# Patient Record
Sex: Female | Born: 1980 | Race: Black or African American | Hispanic: No | Marital: Married | Smoking: Never smoker
Health system: Southern US, Community
[De-identification: ages and names within clinical notes are randomized; demographics above are authoritative.]

## PROBLEM LIST (undated history)

## (undated) ENCOUNTER — Inpatient Hospital Stay (HOSPITAL_COMMUNITY): Payer: Self-pay

## (undated) DIAGNOSIS — D219 Benign neoplasm of connective and other soft tissue, unspecified: Secondary | ICD-10-CM

## (undated) DIAGNOSIS — Z789 Other specified health status: Secondary | ICD-10-CM

## (undated) HISTORY — PX: NO PAST SURGERIES: SHX2092

---

## 2001-04-24 ENCOUNTER — Emergency Department (HOSPITAL_COMMUNITY): Admission: EM | Admit: 2001-04-24 | Discharge: 2001-04-24 | Payer: Self-pay | Admitting: Emergency Medicine

## 2008-10-05 ENCOUNTER — Encounter: Payer: Self-pay | Admitting: *Deleted

## 2008-10-09 ENCOUNTER — Ambulatory Visit: Payer: Self-pay | Admitting: Family Medicine

## 2008-10-09 ENCOUNTER — Encounter: Payer: Self-pay | Admitting: Family Medicine

## 2008-10-11 ENCOUNTER — Encounter: Payer: Self-pay | Admitting: Family Medicine

## 2009-09-06 ENCOUNTER — Ambulatory Visit: Payer: Self-pay | Admitting: Family Medicine

## 2009-11-08 ENCOUNTER — Encounter: Payer: Self-pay | Admitting: Family Medicine

## 2009-11-08 ENCOUNTER — Ambulatory Visit: Payer: Self-pay | Admitting: Family Medicine

## 2009-11-08 DIAGNOSIS — N760 Acute vaginitis: Secondary | ICD-10-CM | POA: Insufficient documentation

## 2009-11-08 LAB — CONVERTED CEMR LAB
Chlamydia, DNA Probe: NEGATIVE
GC Probe Amp, Genital: NEGATIVE

## 2010-01-13 ENCOUNTER — Encounter: Payer: Self-pay | Admitting: *Deleted

## 2010-04-08 NOTE — Assessment & Plan Note (Signed)
Summary: vag prob,df   Vital Signs:  Patient profile:   30 year old female Height:      61 inches Weight:      115 pounds BMI:     21.81 Temp:     98.1 degrees F Pulse rate:   65 / minute BP sitting:   125 / 78  (left arm) Cuff size:   regular  Vitals Entered By: Dennison Nancy RN (November 08, 2009 4:33 PM) CC: Vaginal problems Is Patient Diabetic? No Pain Assessment Patient in pain? no        CC:  Vaginal problems.  History of Present Illness: Developed vagnial discharge out of the blue and is worried that she has a STD.  She has one boyfriend, uses condoms, and has just started OCP.  She stopped her OCPs when the discharge started.  There is no odor, no itch, she denies abdominal pain.  Habits & Providers  Alcohol-Tobacco-Diet     Alcohol drinks/day: 0     Alcohol Counseling: not indicated; patient does not drink     Tobacco Status: never     Tobacco Counseling: not indicated; no tobacco use     Passive Smoke Exposure: no  Current Medications (verified): 1)  Tri-Sprintec 0.18/0.215/0.25 Mg-35 Mcg Tabs (Norgestim-Eth Estrad Triphasic) .... Take One Tablet At The Same Time Daily.  Dispense One Pack  Allergies (verified): No Known Drug Allergies  Review of Systems General:  Denies fever. GU:  Complains of discharge; denies abnormal vaginal bleeding, dysuria, genital sores, and urinary frequency.  Physical Exam  General:  Well-developed,well-nourished,in no acute distress; alert,appropriate and cooperative throughout examination Genitalia:  Normal introitus for age, no external lesions, no vaginal discharge, mucosa pink and moist, no vaginal or cervical lesions, no vaginal atrophy, no friaility or hemorrhage, normal uterus size and position, no adnexal masses or tenderness Strong odor but no clues on wet mount   Impression & Recommendations:  Problem # 1:  VULVOVAGINITIS (ICD-616.10) Reasurance, STD screen, low risk with one partner, uses condoms, and on  OCPs. Orders: GC/Chlamydia-[facility] ([id number]) Wet Prep- [facility] [phone]) [facility]- Est Level  3 (95621)  Complete Medication List: 1)  Tri-sprintec 0.18/0.215/0.25 Mg-35 Mcg Tabs (Norgestim-eth estrad triphasic) .... Take one tablet at the same time daily.  dispense one pack  Patient Instructions: 1)  Please schedule a follow-up appointment as needed .   Laboratory Results  Date/Time Received: November 08, 2009 4:53 PM  Date/Time Reported: November 08, 2009 4:56 PM   November 08, 2009 4:58 PM   [employer] Source: vaginal WBC/hpf: 0-2 Bacteria/hpf: trace  Rods Clue cells/hpf: none  Negative whiff Yeast/hpf: none Trichomonas/hpf: none Comments: rare RBC present ...........test performed by...........Marland KitchenTerese Door, CMA

## 2010-04-08 NOTE — Miscellaneous (Signed)
Summary: needs appointment  Clinical Lists Changes  message left to return call to discuss making appointment and med refill. Theresia Lo RN  January 13, 2010 5:35 PM

## 2010-04-08 NOTE — Assessment & Plan Note (Signed)
Summary: discuss birth control,tcb   Vital Signs:  Patient profile:   30 year old female Height:      61 inches Weight:      118.2 pounds BMI:     22.41 Temp:     98.2 degrees F oral Pulse rate:   78 / minute BP sitting:   110 / 77  (left arm) Cuff size:   regular  Vitals Entered By: Gladstone Pih (September 06, 2009 10:32 AM) CC: discuss birth control Is Patient Diabetic? No Pain Assessment Patient in pain? no        CC:  discuss birth control.  History of Present Illness: 30 yo healthy female here to discuss birth control.  G0P0 planning on conception in next 1-2 years.  Would like contraception until then. Has not ever used anything except for condoms. Non smoker.  No fam hx of PE,DVT.  LMP early June.  Habits & Providers  Alcohol-Tobacco-Diet     Tobacco Status: never  Current Medications (verified): 1)  Tri-Sprintec 0.18/0.215/0.25 Mg-35 Mcg Tabs (Norgestim-Eth Estrad Triphasic) .... Take One Tablet At The Same Time Daily.  Dispense One Pack  Allergies (verified): No Known Drug Allergies  Family History: Grandmother died from breast cancer No Hx PE/DVT  Review of Systems      See HPI GU:  Denies abnormal vaginal bleeding, discharge, and dysuria.  Physical Exam  General:  Well-developed,well-nourished,in no acute distress; alert,appropriate and cooperative throughout examination. vitals reviewed   Impression & Recommendations:  Problem # 1:  CONTRACEPTIVE MANAGEMENT (ICD-V25.09)  Discussed all options including mirena and implanon but not cost effective given desired fertility in 1-2 years.  Pt would like to try OCP and if cannot remember will ask for depo.  Will f/u in 1 month to see how it is going and to get annual gynecological exam.  Orders: [facility]- Est Level  3 ([id number])  Complete Medication List: 1)  Tri-sprintec 0.18/0.215/0.25 Mg-35 Mcg Tabs (Norgestim-eth estrad triphasic) .... Take one tablet at the same time daily.  dispense one pack  Patient  Instructions: 1)  Make appointment for annual gynecological exam after 10/08/2009. 2)  There are many birth control options!  Please let me know if you have any problems. 3)  Nice to meet you! Prescriptions: TRI-SPRINTEC 0.18/0.215/0.25 MG-35 MCG TABS (NORGESTIM-ETH ESTRAD TRIPHASIC) take one tablet at the same time daily.  Dispense one pack  #1 x 2   Entered and Authorized by:   Delbert Harness MD   Signed by:   Delbert Harness MD on 09/06/2009   Method used:   Electronically to        [facility]  [address] [phone] (retail)       [address]       [place], [place]  [address]       Ph: [phone] or [phone]       Fax: [phone]   RxID:   [id number]

## 2010-04-25 ENCOUNTER — Encounter: Payer: Self-pay | Admitting: *Deleted

## 2010-10-12 ENCOUNTER — Inpatient Hospital Stay (INDEPENDENT_AMBULATORY_CARE_PROVIDER_SITE_OTHER)
Admission: RE | Admit: 2010-10-12 | Discharge: 2010-10-12 | Disposition: A | Discharge status: Home / Self Care | Payer: BC Managed Care – PPO | Source: Ambulatory Visit | Attending: Family Medicine | Admitting: Family Medicine

## 2010-10-12 DIAGNOSIS — M79609 Pain in unspecified limb: Secondary | ICD-10-CM

## 2010-12-11 ENCOUNTER — Encounter: Payer: BC Managed Care – PPO | Admitting: Family Medicine

## 2010-12-24 ENCOUNTER — Encounter: Payer: BC Managed Care – PPO | Admitting: Family Medicine

## 2010-12-30 ENCOUNTER — Other Ambulatory Visit: Payer: Self-pay | Admitting: Family Medicine

## 2010-12-30 ENCOUNTER — Ambulatory Visit (INDEPENDENT_AMBULATORY_CARE_PROVIDER_SITE_OTHER): Payer: BC Managed Care – PPO | Admitting: Family Medicine

## 2010-12-30 ENCOUNTER — Encounter: Payer: Self-pay | Admitting: Family Medicine

## 2010-12-30 ENCOUNTER — Telehealth: Payer: Self-pay | Admitting: Family Medicine

## 2010-12-30 VITALS — BP 131/83 | HR 52 | Temp 97.9°F | Ht 60.0 in | Wt 108.8 lb

## 2010-12-30 DIAGNOSIS — Z23 Encounter for immunization: Secondary | ICD-10-CM

## 2010-12-30 MED ORDER — NORGESTIM-ETH ESTRAD TRIPHASIC 0.18/0.215/0.25 MG-35 MCG PO TABS: 1.0000 | ORAL_TABLET | Freq: Every day | ORAL | Status: DC

## 2010-12-30 NOTE — Progress Notes (Signed)
  Subjective:     Renee Coleman is a 30 y.o. female and is here for a comprehensive physical exam. The patient reports no problems.  Declines pap smear at this time.  History   Social History  . Marital Status: Single   Occupational History  . Works as a Midwife   Social History Main Topics  . Smoking status: Never Smoker   . Smokeless tobacco: Denies  . Alcohol Use: Social drinker on the weekends  . Drug Use: Denies  . Sexually Active: Denies    Health Maintenance  Topic Date Due  . Influenza Vaccine  12/08/2010  . Pap Smear  10/10/2011  . Tetanus/tdap  12/29/2020    The following portions of the patient's history were reviewed and updated as appropriate: allergies, current medications, past medical history and past social history.  Review of Systems Pertinent items are noted in HPI.   Objective:    General appearance: alert, cooperative and no distress Head: Normocephalic, without obvious abnormality, atraumatic Eyes: conjunctivae/corneas clear. PERRL, EOM's intact. Fundi benign. Neck: no adenopathy, no JVD and supple, symmetrical, trachea midline Back: symmetric, no curvature. ROM normal. No CVA tenderness. Lungs: clear to auscultation bilaterally Heart: regular rate and rhythm, S1, S2 normal, no murmur, click, rub or gallop Abdomen: soft, non-tender; bowel sounds normal; no masses,  no organomegaly Extremities: extremities normal, atraumatic, no cyanosis or edema Pulses: 2+ and symmetric Skin: Skin color, texture, turgor normal. No rashes or lesions    Assessment:    Healthy female exam.     Plan:    Please schedule annual follow up exam in 12 months or sooner as needed.

## 2010-12-30 NOTE — Telephone Encounter (Signed)
Refill request.  Pt has appt today for CPE.

## 2010-12-30 NOTE — Telephone Encounter (Signed)
Addended by: Tye Savoy, IVY on: 12/30/2010 04:37 PM   Modules accepted: Orders

## 2010-12-30 NOTE — Patient Instructions (Signed)
Please follow up appointment in 12 months or sooner as needed.

## 2010-12-30 NOTE — Telephone Encounter (Signed)
Will forward message to Dr. Tye Savoy. Patient has appointment with her for CPE this afternoon.

## 2011-01-07 NOTE — Telephone Encounter (Signed)
Refill request

## 2011-01-08 ENCOUNTER — Ambulatory Visit (INDEPENDENT_AMBULATORY_CARE_PROVIDER_SITE_OTHER): Payer: BC Managed Care – PPO | Admitting: Family Medicine

## 2011-01-08 ENCOUNTER — Other Ambulatory Visit: Payer: Self-pay | Admitting: Family Medicine

## 2011-01-08 ENCOUNTER — Encounter: Payer: Self-pay | Admitting: Family Medicine

## 2011-01-08 VITALS — BP 138/94 | HR 68 | Temp 98.2°F | Ht 60.0 in | Wt 107.0 lb

## 2011-01-08 DIAGNOSIS — Z202 Contact with and (suspected) exposure to infections with a predominantly sexual mode of transmission: Secondary | ICD-10-CM | POA: Insufficient documentation

## 2011-01-08 DIAGNOSIS — Z20828 Contact with and (suspected) exposure to other viral communicable diseases: Secondary | ICD-10-CM

## 2011-01-08 DIAGNOSIS — N760 Acute vaginitis: Secondary | ICD-10-CM

## 2011-01-08 LAB — POCT WET PREP (WET MOUNT): Clue Cells Wet Prep HPF POC: NEGATIVE

## 2011-01-08 NOTE — Progress Notes (Signed)
  Subjective:    Patient ID: Renee Coleman, female    DOB: Sep 19, 1980, 30 y.o.   MRN: [id number]  HPI  Patient presents to clinic concerned that she may have been exposed to herpes simplex virus, genital. Patient has had one sexual partner in the last 10 months. She has had one sexual encounter with him in April. 5 days ago her partner told her that he had genital herpes patient states that a condom was used during intercourse, as she is still concerned that she may have come in contact with disease.   Patient has no complaints. She denies any pain with urination, vaginal discharge, vaginal bleeding, or pain with intercourse. She denies any pelvic pain or low back pain. Denies any fevers, chills, nausea/vomiting. Patient states that her partner lives in [place], so she does not know if he has been faithful to her. Patient would like to get tested for her gonorrhea/Chlamydia, genital herpes, and other sexual transmitted diseases.  Review of Systems  Per history of present illness    Objective:   Physical Exam  Constitutional: No distress.  Abdominal: Soft. Bowel sounds are normal. She exhibits no distension. There is no tenderness. There is no rebound and no guarding.  Genitourinary: There is no rash, tenderness, lesion or injury on the right labia. There is no rash, tenderness, lesion or injury on the left labia. Cervix exhibits no motion tenderness, no discharge and no friability. Right adnexum displays no mass, no tenderness and no fullness. Left adnexum displays no mass, no tenderness and no fullness. No erythema, tenderness or bleeding around the vagina. No foreign body around the vagina. Vaginal discharge found.  No sores or herpetic lesions noted.        Assessment & Plan:

## 2011-01-08 NOTE — Patient Instructions (Signed)
It was good to see you again. I will call or send you a letter with recent lab results. If you have any questions/concerns, please call our office.  Genital Herpes  Genital herpes is a sexually transmitted disease. This means that it is a disease passed by having sex with an infected person. There is no cure for genital herpes. The time between attacks can be months to years. The virus may live in a person but produce no problems (symptoms). This infection can be passed to a baby as it travels down the birth canal (vagina). In a newborn, this can cause central nervous system damage, eye damage, or even death. The virus that causes genital herpes is usually HSV-2 virus. The virus that causes oral herpes is usually HSV-1. The diagnosis (learning what is wrong) is made through culture results. SYMPTOMS  Usually symptoms of pain and itching begin a few days to a week after contact. It first appears as small blisters that progress to small painful ulcers which then scab over and heal after several days. It affects the outer genitalia, birth canal, cervix, penis, anal area, buttocks, and thighs. HOME CARE INSTRUCTIONS   Keep ulcerated areas dry and clean.   Take medications as directed. Antiviral medications can speed up healing. They will not prevent recurrences or cure this infection. These medications can also be taken for suppression if there are frequent recurrences.   While the infection is active, it is contagious. Avoid all sexual contact during active infections.   Condoms may help prevent spread of the herpes virus.   Practice safe sex.   Wash your hands thoroughly after touching the genital area.   Avoid touching your eyes after touching your genital area.   Inform your caregiver if you have had genital herpes and become pregnant. It is your responsibility to insure a safe outcome for your baby in this pregnancy.   Only take over-the-counter or prescription medicines for pain,  discomfort, or fever as directed by your caregiver.  SEEK MEDICAL CARE IF:   You have a recurrence of this infection.   You do not respond to medications and are not improving.   You have new sources of pain or discharge which have changed from the original infection.   You have an oral temperature above 102 F (38.9 C).   You develop abdominal pain.   You develop eye pain or signs of eye infection.    Document Released: 02/21/2000 Document Revised: 11/05/2010 Document Reviewed: 03/13/2009 [facility] Patient Information 2012 [place], [place].

## 2011-01-08 NOTE — Assessment & Plan Note (Signed)
No active lesions found on exam. Will order HSV culture. Will not treat with acyclovir right now. Patient agreed/understood plan.

## 2011-01-08 NOTE — Assessment & Plan Note (Signed)
The patient recently exposed to genital herpes. Will also check for gonorrhea, chlamydia, and do a wet prep today. Will call patient or send letter with results.

## 2011-01-09 LAB — GC/CHLAMYDIA PROBE AMP, GENITAL
Chlamydia, DNA Probe: NEGATIVE
GC Probe Amp, Genital: NEGATIVE

## 2011-01-09 NOTE — Telephone Encounter (Signed)
Refill request

## 2011-01-10 ENCOUNTER — Encounter: Payer: Self-pay | Admitting: Family Medicine

## 2011-01-13 ENCOUNTER — Encounter: Payer: Self-pay | Admitting: Family Medicine

## 2011-03-11 ENCOUNTER — Other Ambulatory Visit: Payer: Self-pay | Admitting: Family Medicine

## 2011-03-12 NOTE — Telephone Encounter (Signed)
Refill request

## 2011-03-14 ENCOUNTER — Other Ambulatory Visit: Payer: Self-pay | Admitting: Family Medicine

## 2011-03-14 NOTE — Telephone Encounter (Signed)
Refill request

## 2011-08-06 ENCOUNTER — Other Ambulatory Visit: Payer: Self-pay | Admitting: *Deleted

## 2011-08-06 MED ORDER — NORGESTIM-ETH ESTRAD TRIPHASIC 0.18/0.215/0.25 MG-35 MCG PO TABS: 1.0000 | ORAL_TABLET | Freq: Every day | ORAL | Status: DC

## 2011-12-23 ENCOUNTER — Ambulatory Visit (INDEPENDENT_AMBULATORY_CARE_PROVIDER_SITE_OTHER): Payer: BC Managed Care – PPO | Admitting: Family Medicine

## 2011-12-23 ENCOUNTER — Telehealth: Payer: Self-pay | Admitting: Family Medicine

## 2011-12-23 ENCOUNTER — Other Ambulatory Visit (HOSPITAL_COMMUNITY)
Admission: RE | Admit: 2011-12-23 | Discharge: 2011-12-23 | Disposition: A | Discharge status: Home / Self Care | Payer: BC Managed Care – PPO | Source: Ambulatory Visit | Attending: Family Medicine | Admitting: Family Medicine

## 2011-12-23 ENCOUNTER — Encounter: Payer: Self-pay | Admitting: Family Medicine

## 2011-12-23 VITALS — BP 156/93 | HR 88 | Ht 60.0 in | Wt 113.0 lb

## 2011-12-23 DIAGNOSIS — N760 Acute vaginitis: Secondary | ICD-10-CM

## 2011-12-23 DIAGNOSIS — Z113 Encounter for screening for infections with a predominantly sexual mode of transmission: Secondary | ICD-10-CM | POA: Insufficient documentation

## 2011-12-23 DIAGNOSIS — Z7251 High risk heterosexual behavior: Secondary | ICD-10-CM

## 2011-12-23 LAB — POCT WET PREP (WET MOUNT): Clue Cells Wet Prep Whiff POC: NEGATIVE

## 2011-12-23 MED ORDER — GUAIFENESIN-CODEINE 200-10 MG/5ML PO LIQD: 5.0000 mL | Freq: Every evening | ORAL | Status: DC | PRN

## 2011-12-23 MED ORDER — IPRATROPIUM BROMIDE 0.02 % IN SOLN: 500.0000 ug | Freq: Three times a day (TID) | RESPIRATORY_TRACT | Status: DC

## 2011-12-23 MED ORDER — CEPHALEXIN 500 MG PO CAPS: 500.0000 mg | ORAL_CAPSULE | Freq: Two times a day (BID) | ORAL | Status: DC

## 2011-12-23 MED ORDER — BECLOMETHASONE DIPROPIONATE 40 MCG/ACT IN AERS: 2.0000 | INHALATION_SPRAY | Freq: Two times a day (BID) | RESPIRATORY_TRACT | Status: DC

## 2011-12-23 MED ORDER — FLUCONAZOLE 150 MG PO TABS: ORAL_TABLET | ORAL | Status: DC

## 2011-12-23 NOTE — Patient Instructions (Addendum)
It was good to meet you.  One swab will come back today, the second one will come back sometime tomorrow or the day after.  We will let you know the results.  It was good to meet you

## 2011-12-23 NOTE — Progress Notes (Signed)
  Subjective:    Patient ID: Renee Coleman, female    DOB: 10-18-1980, 31 y.o.   MRN: [id number]  HPI  1.  STD check:  Patient started having unprotected sexual intercourse with new partner about 2-3 months ago.  No symptoms but wants to be checked.  Has had no vaginal discharge, bleeding, or abdominal pain.  LMP was end of September (about 2 weeks ago).  No nausea or vomiting.    Review of Systems     Objective:   Physical Exam  Gen:  Alert, cooperative patient who appears stated age in no acute distress.  Vital signs reviewed. GYN:  External genitalia within normal limits.  Vaginal mucosa pink, moist, normal rugae.  Nonfriable cervix without lesions, some mild discharge, white clumps in nature, but no bleeding noted on speculum exam.  Bimanual exam deferred Skin:  1.5 scaly patch on Right hip (she brought this up right before speculum exam  ** Please note that I sent in several of another patient's medications (Atrovent, Keflex, etc) to this patient by accident.  Clinic staff called pharmacy to cancel prescriptions.        Assessment & Plan:

## 2011-12-23 NOTE — Telephone Encounter (Signed)
Left message to call back.  She has yeast and I have sent in Diflucan for her.

## 2011-12-24 NOTE — Assessment & Plan Note (Signed)
Yeast vaginitis found today. Awaiting GC/Chlamydia swabs as well.  Will call patient with results.

## 2011-12-25 ENCOUNTER — Telehealth: Payer: Self-pay | Admitting: Family Medicine

## 2011-12-25 ENCOUNTER — Encounter: Payer: Self-pay | Admitting: Family Medicine

## 2011-12-25 NOTE — Telephone Encounter (Signed)
error 

## 2012-01-06 ENCOUNTER — Encounter: Payer: Self-pay | Admitting: Family Medicine

## 2012-01-06 ENCOUNTER — Ambulatory Visit (INDEPENDENT_AMBULATORY_CARE_PROVIDER_SITE_OTHER): Payer: BC Managed Care – PPO | Admitting: Family Medicine

## 2012-01-06 ENCOUNTER — Other Ambulatory Visit (HOSPITAL_COMMUNITY)
Admission: RE | Admit: 2012-01-06 | Discharge: 2012-01-06 | Disposition: A | Discharge status: Home / Self Care | Payer: BC Managed Care – PPO | Source: Ambulatory Visit | Attending: Family Medicine | Admitting: Family Medicine

## 2012-01-06 VITALS — BP 123/88 | HR 74 | Temp 98.2°F | Ht 60.0 in | Wt 112.1 lb

## 2012-01-06 DIAGNOSIS — Z0000 Encounter for general adult medical examination without abnormal findings: Secondary | ICD-10-CM

## 2012-01-06 DIAGNOSIS — Z01419 Encounter for gynecological examination (general) (routine) without abnormal findings: Secondary | ICD-10-CM | POA: Insufficient documentation

## 2012-01-06 DIAGNOSIS — Z124 Encounter for screening for malignant neoplasm of cervix: Secondary | ICD-10-CM

## 2012-01-06 DIAGNOSIS — Z1151 Encounter for screening for human papillomavirus (HPV): Secondary | ICD-10-CM | POA: Insufficient documentation

## 2012-01-06 DIAGNOSIS — R87810 Cervical high risk human papillomavirus (HPV) DNA test positive: Secondary | ICD-10-CM | POA: Insufficient documentation

## 2012-01-06 NOTE — Assessment & Plan Note (Signed)
Pap and breast exam performed.  No red flags.  Return in one year.

## 2012-01-06 NOTE — Progress Notes (Signed)
  Subjective:     Renee Coleman is a 31 y.o. female and is here for a comprehensive physical exam. The patient reports no problems.  She was seen here recently for exposure to STD and we reviewed the results today.  Patient has no other complaints.  Would like to have a breast exam and pap done today.  History   Social History  . Marital Status: Single    Spouse Name: N/A    Number of Children: N/A  . Years of Education: N/A   Occupational History  . Not on file.   Social History Main Topics  . Smoking status: Never Smoker   . Smokeless tobacco: Never Used  . Alcohol Use: 0.6 oz/week    1 Glasses of wine per week     1-2 drinks per day  . Drug Use: No  . Sexually Active: Yes    Birth Control/ Protection: Pill    Social History Narrative   Health Maintenance  Topic Date Due  . Pap Smear  10/10/2011  . Influenza Vaccine  11/08/2011  . Tetanus/tdap  12/29/2020    The following portions of the patient's history were reviewed and updated as appropriate: allergies, current medications, past social history and problem list.  Review of Systems Pertinent items are noted in HPI.   Objective:    General appearance: alert and cooperative Ears: normal TM's and external ear canals both ears Nose: Nares normal. Septum midline. Mucosa normal. No drainage or sinus tenderness. Throat: lips, mucosa, and tongue normal; teeth and gums normal Lungs: clear to auscultation bilaterally Heart: regular rate and rhythm, S1, S2 normal, no murmur, click, rub or gallop Abdomen: soft, non-tender; bowel sounds normal; no masses,  no organomegaly Pelvic: cervix normal in appearance, external genitalia normal, no adnexal masses or tenderness, no cervical motion tenderness, uterus normal size, shape, and consistency and vagina normal without discharge Extremities: extremities normal, atraumatic, no cyanosis or edema Skin: Skin color, texture, turgor normal. No rashes or lesions  Breast exam: no  lesions, masses, nipple abnormalities appreciated   Assessment:    Healthy female exam.      Plan:   See problem List   See After Visit Summary for Counseling Recommendations

## 2012-04-12 ENCOUNTER — Encounter: Payer: Self-pay | Admitting: Family Medicine

## 2012-04-12 ENCOUNTER — Ambulatory Visit (INDEPENDENT_AMBULATORY_CARE_PROVIDER_SITE_OTHER): Payer: BC Managed Care – PPO | Admitting: Family Medicine

## 2012-04-12 VITALS — BP 130/81 | HR 66 | Temp 98.2°F | Ht 60.0 in | Wt 117.6 lb

## 2012-04-12 DIAGNOSIS — J029 Acute pharyngitis, unspecified: Secondary | ICD-10-CM

## 2012-04-12 NOTE — Patient Instructions (Signed)
Viral Pharyngitis  Viral pharyngitis is a viral infection that produces redness, pain, and swelling (inflammation) of the throat. It can spread from person to person (contagious).  CAUSES  Viral pharyngitis is caused by inhaling a large amount of certain germs called viruses. Many different viruses cause viral pharyngitis.  SYMPTOMS  Symptoms of viral pharyngitis include:   Sore throat.   Tiredness.   Stuffy nose.   Low-grade fever.   Congestion.   Cough.  TREATMENT  Treatment includes rest, drinking plenty of fluids, and the use of over-the-counter medication (approved by your caregiver).  HOME CARE INSTRUCTIONS    Drink enough fluids to keep your urine clear or pale yellow.   Eat soft, cold foods such as ice cream, frozen ice pops, or gelatin dessert.   Gargle with warm salt water (1 tsp salt per 1 qt of water).   If over age 7, throat lozenges may be used safely.   Only take over-the-counter or prescription medicines for pain, discomfort, or fever as directed by your caregiver. Do not take aspirin.  To help prevent spreading viral pharyngitis to others, avoid:   Mouth-to-mouth contact with others.   Sharing utensils for eating and drinking.   Coughing around others.  SEEK MEDICAL CARE IF:    You are better in a few days, then become worse.   You have a fever or pain not helped by pain medicines.   There are any other changes that concern you.  Document Released: 12/03/2004 Document Revised: 05/18/2011 Document Reviewed: 05/01/2010  [employer] Patient Information 2013 [employer].

## 2012-04-12 NOTE — Progress Notes (Signed)
  Subjective:    Patient ID: Renee Coleman, female    DOB: [birth year], 32 y.o.   MRN: [id number]  HPI 32 yo here for work in appt for sore throat  3 days, sore throat.  No cough, rhinorrhea.  Felt feverish 2 days ago with fatigue.    I have reviewed patient's  PMH, FH, and Social history and Medications as related to this visit. Nonsmoker   Review of Systemssee HPI    Objective:   Physical Exam GEN: Alert & Oriented, No acute distress HEENT: [place]/AT. EOMI, PERRLA, no conjunctival injection or scleral icterus.  Bilateral tympanic membranes intact without erythema or effusion.  .  Nares without edema or rhinorrhea.  Oropharynx is with erythema but no tonsillar hypertrophy or exudate.  No anterior or posterior cervical lymphadenopathy. CV:  Regular Rate & Rhythm, no murmur Respiratory:  Normal work of breathing, CTAB         Assessment & Plan:

## 2012-05-03 ENCOUNTER — Telehealth: Payer: Self-pay | Admitting: Family Medicine

## 2012-05-03 NOTE — Telephone Encounter (Addendum)
Spoke with patient . States she had her regular monthly period   started 02/07. Then she started with spotting again on 02/24. Describes it as spotting but did continue all day and into today.  She thought she should  stop pills and did not take today.  Advised that it would be best to continue pills and I will send message to Dr. Tye Savoy for further advice.  She agrees with this.  States in December she also had spotting mid cycle.  States she has not missed pills prior to the one today.

## 2012-05-03 NOTE — Telephone Encounter (Signed)
Is spotting in between periods and wants to know if she needs to change her BCP - needs advise

## 2012-05-03 NOTE — Telephone Encounter (Signed)
Message left on voicemail to continue as we had discussed and if continues to follow up with Dr. Tye Savoy.

## 2012-05-03 NOTE — Telephone Encounter (Signed)
Message left to return call.

## 2012-05-03 NOTE — Telephone Encounter (Signed)
I agree with your plan for patient to continue [facility] pills.  I cannot see on Epic how long she has been taking these pills.  If spotting continues, she should schedule appointment for Urine pregnancy test and wet prep for further evaluation of spotting.  Thanks [employer].

## 2012-05-03 NOTE — Telephone Encounter (Signed)
Message again  left to return call.

## 2012-05-04 NOTE — Telephone Encounter (Signed)
Attempted to call patient to discuss OCP.  Left voicemail message.  Will try again later today.

## 2012-05-05 NOTE — Telephone Encounter (Signed)
LVM again.  Advised patient to schedule appointment with since we keep playing phone tag.

## 2012-06-29 ENCOUNTER — Ambulatory Visit (INDEPENDENT_AMBULATORY_CARE_PROVIDER_SITE_OTHER): Payer: BC Managed Care – PPO | Admitting: Family Medicine

## 2012-06-29 ENCOUNTER — Other Ambulatory Visit (HOSPITAL_COMMUNITY)
Admission: RE | Admit: 2012-06-29 | Discharge: 2012-06-29 | Disposition: A | Discharge status: Home / Self Care | Payer: BC Managed Care – PPO | Source: Ambulatory Visit | Attending: Family Medicine | Admitting: Family Medicine

## 2012-06-29 ENCOUNTER — Encounter: Payer: Self-pay | Admitting: Family Medicine

## 2012-06-29 VITALS — BP 139/92 | HR 68 | Temp 97.9°F | Ht 60.0 in | Wt 114.8 lb

## 2012-06-29 DIAGNOSIS — N760 Acute vaginitis: Secondary | ICD-10-CM

## 2012-06-29 DIAGNOSIS — N898 Other specified noninflammatory disorders of vagina: Secondary | ICD-10-CM

## 2012-06-29 DIAGNOSIS — N921 Excessive and frequent menstruation with irregular cycle: Secondary | ICD-10-CM

## 2012-06-29 DIAGNOSIS — Z113 Encounter for screening for infections with a predominantly sexual mode of transmission: Secondary | ICD-10-CM | POA: Insufficient documentation

## 2012-06-29 DIAGNOSIS — N939 Abnormal uterine and vaginal bleeding, unspecified: Secondary | ICD-10-CM

## 2012-06-29 LAB — POCT WET PREP (WET MOUNT)

## 2012-06-29 LAB — POCT URINE PREGNANCY: Preg Test, Ur: NEGATIVE

## 2012-06-29 NOTE — Progress Notes (Signed)
  Subjective:    Patient ID: Renee Coleman, female    DOB: 1980/06/20, 32 y.o.   MRN: [id number]  HPI  Patient presents to clinic for vaginal discharge.  Discharge is a light brown color, thick consistency.  No itching around skin of vagina.  Denies any associated dysuria.  Denies any fever, chills, vomiting.  Denies any pelvic or low back pain.  She is currently sexually active with one partner in the last 2 months.  She does have a hx of vaginal spotting 2 months ago.  Last period was the first week of April, then developed spotting, which is gradually resolving.  She has been noticing brown color spots on panty-liner.  Review of Systems Per HPI    Objective:   Physical Exam  Constitutional: She appears well-nourished. No distress.  Abdominal: Soft.  Genitourinary: Uterus normal. There is no rash, tenderness or lesion on the right labia. There is no rash, tenderness or lesion on the left labia. No erythema around the vagina. Vaginal discharge found.  Discharge is thick, white with brown-tinged mucus.     Assessment & Plan:

## 2012-06-29 NOTE — Assessment & Plan Note (Signed)
Will check wet prep, GC/chlamydia today and notify of results.  Advised patient to use condoms to prevent STD.

## 2012-06-29 NOTE — Assessment & Plan Note (Signed)
No blood found in vagina on exam, however I was unable to visualize cervix.  Dark brown spotting could be due to discharge.  Urine pregnancy test negative.  Continue to monitor for persistent vaginal spotting.

## 2012-06-29 NOTE — Patient Instructions (Addendum)
Please refer to handout below regarding common causes of vaginitis. It typically takes about 1-2 days for results to return. If today's lab results are ABNORMAL, we will call you and send medication to your pharmacy.   If you develop worsening symptoms, fever (temperature > 101.5 degrees), nausea/vomiting, or pelvic pain, please return to clinic.  What is vaginitis? Vaginitis is an inflammation of the vagina. Vulvovaginitis refers to inflammation of both the vagina and vulva (the external female genitals).   What are the most common types of vaginitis? The six most common types of vaginitis are:  Candida or "yeast" vaginitis  Bacterial vaginosis  Trichomoniasis vaginitis (a sexually transmitted infection)   What are candida or "yeast" infections? Yeast infections of the vagina are what most women think of when they hear the term "vaginitis." Yeast infections are caused by one of the many species of fungus called candida. Candida normally live in small numbers in the vagina, as well as in the mouth and digestive tract of both men and women.  Yeast infections produce a thick, white vaginal discharge with the consistency of cottage cheese. Although the discharge can be somewhat watery, it is odorless. Yeast infections usually cause the vagina and the vulva to be very itchy and red, even before the onset of discharge.  If yeast is normal in a woman's vagina, what makes it cause an infection? Usually, infection occurs when a change in the delicate balance in a woman's system takes place.   What is bacterial vaginosis? Although "yeast" is the name most women know, bacterial vaginosis (BV) actually is the most common vaginal infection in women of reproductive age. Bacterial vaginosis often will cause a vaginal discharge. The discharge usually is thin and milky, and is described as having a "fishy" odor. This odor may become more noticeable after intercourse.  Redness or itching of the vagina are not  common symptoms of bacterial vaginosis. Some women with BV have no symptoms at all, and the vaginitis is only discovered during a routine gynecologic exam. Bacterial vaginosis is caused by a combination of several bacteria. These bacteria seem to overgrow in much the same way as do candida when the vaginal pH balance is upset.  Because bacterial vaginosis is caused by bacteria and not by yeast, medicine that is appropriate for yeast is not effective against the bacteria that cause bacterial vaginosis.  BV is treated in asymptomatic females of child bearing age to decrease their risk of preterm delivery.  Risk factors for BV include:  New or multiple sexual partners  Douching  Cigarette smoking  What is trichomoniasis? Trichomoniasis - Trichomoniasis is caused by a tiny single-celled organism known as a "protozoa." When this organism infects the vagina, it can cause a frothy, greenish-yellow discharge. Often this discharge will have a foul smell. Women with trichomonal vaginitis may complain of itching and soreness of the vagina and vulva, as well as burning during urination. In addition, there can be discomfort in the lower abdomen and vaginal pain with intercourse. These symptoms may be worse after the menstrual period. Many women, however, do not develop any symptoms. It is important to understand that this type of vaginitis can be transmitted through sexual intercourse. For treatment to be effective, the sexual partner must be treated at the same time as the patient.

## 2012-06-30 ENCOUNTER — Telehealth: Payer: Self-pay | Admitting: Family Medicine

## 2012-06-30 NOTE — Telephone Encounter (Signed)
Called patient's home and left a message.  Normal results.

## 2012-08-07 ENCOUNTER — Other Ambulatory Visit: Payer: Self-pay | Admitting: Family Medicine

## 2012-08-30 ENCOUNTER — Telehealth: Payer: Self-pay | Admitting: Family Medicine

## 2012-08-30 NOTE — Telephone Encounter (Signed)
Pt is on birth control.wants to know if it is ok to take a prenatal vitamin as a supplement. Would there be any side effects?

## 2012-08-30 NOTE — Telephone Encounter (Signed)
Will fwd to MD for advice.  Stanford Strauch L, CMA  

## 2012-08-31 ENCOUNTER — Telehealth: Payer: Self-pay | Admitting: Family Medicine

## 2012-08-31 NOTE — Telephone Encounter (Signed)
Please let her know she can take both OCP and vitamin.  Thanks!

## 2012-08-31 NOTE — Telephone Encounter (Signed)
Patient was calling to see if she can take pre-natal vitamins and if it would effect her Birth control pills. JW

## 2012-08-31 NOTE — Telephone Encounter (Signed)
Pt notified.  Christain Niznik L, CMA  

## 2012-11-21 ENCOUNTER — Encounter: Payer: Self-pay | Admitting: Family Medicine

## 2012-11-21 ENCOUNTER — Ambulatory Visit (INDEPENDENT_AMBULATORY_CARE_PROVIDER_SITE_OTHER): Payer: BC Managed Care – PPO | Admitting: Family Medicine

## 2012-11-21 VITALS — BP 138/84 | HR 69 | Ht 60.0 in | Wt 119.5 lb

## 2012-11-21 DIAGNOSIS — Z3169 Encounter for other general counseling and advice on procreation: Secondary | ICD-10-CM

## 2012-11-21 NOTE — Patient Instructions (Addendum)
Preparing for Pregnancy Preparing for pregnancy (preconceptual care) by getting counseling and information from your caregiver before getting pregnant is a good idea. It will help you and your baby have a better chance to have a healthy, safe pregnancy and delivery of your baby. Make an appointment with your caregiver to talk about your health, medical, and family history and how to prepare yourself before getting pregnant. Your caregiver will do a complete physical exam and a Pap test. They will want to know:  About you, your spouse or partner, and your family's medical and genetic history.  If you are eating a balanced diet and drinking enough fluids.  What vitamins and mineral supplements you are taking. This includes taking folic acid before getting pregnant to help prevent birth defects.  What medications you are taking including prescription, over-the-counter and herbal medications.  If there is any substance abuse like alcohol, smoking, and illegal drugs.  If there is any mental or physical domestic violence.  If there is any risk of sexually transmitted disease between you and your partner.  What immunizations and vaccinations you have had and what you may need before getting pregnant.  If you should get tested for HIV infection.  If there is any exposure to chemical or toxic substances at home or work.  If there are medical problems you have that need to be treated and kept under control before getting pregnant such as diabetes, high blood pressure or others.  If there were any past surgeries, pregnancies and problems with them.  What your current weight is and to set a goal as to how much weight you should gain while pregnant. Also, they will check if you should lose or gain weight before getting pregnant.  What is your exercise routine and what it is safe when you are pregnant.  If there are any physical disabilities that need to be addressed.  About spacing your  pregnancies when there are other children.  If there is a financial problem that may affect you having a child. After talking about the above points with your caregiver, your caregiver will give you advice on how to help treat and work with you on solving any issues, if necessary, before getting pregnant. The goal is to have a healthy and safe pregnancy for you and your baby. You should keep an accurate record of your menstrual periods because it will help in determining your due date. Immunizations that you should have before getting pregnant:   Regular measles, German measles (rubella) and mumps.  Tetanus and diphtheria.  Chickenpox, if not immune.  Herpes zoster (Varicella) if not immune.  Human papilloma virus vaccine (HPV) between the age of 9 and 26 years old.  Hepatitis A vaccine.  Hepatitis B vaccine.  Influenza vaccine.  Pneumococcal vaccine (pneumonia). You should avoid getting pregnant for one month after getting vaccinated with a live virus vaccine such as German measles (rubella) vaccine. Other immunizations may be necessary depending on where you live, such as malaria. Ask your caregiver if any other immunizations are needed for you. HOME CARE INSTRUCTIONS   Follow the advice of your caregiver.  Before getting pregnant:  Begin taking vitamins, supplements, and 0.4 milligrams folic acid daily.  Get your immunizations up-to-date.  Get help from a nutrition counselor if you do not understand what a balanced diet is, need help with a special medical diet or if you need help to lose or gain weight.  Begin exercising.  Stop smoking, taking illegal drugs,   and drinking alcoholic beverages.  Get counseling if there is and type of domestic violence.  Get checked for sexually transmitted diseases including HIV.  Get any medical problems under control (diabetes, high blood pressure, convulsions, asthma or others).  Resolve any financial concerns.  Be sure you and  your spouse or partner are ready to have a baby.  Keep an accurate record of your menstrual periods. Document Released: 02/06/2008 Document Revised: 05/18/2011 Document Reviewed: 02/06/2008 [employer] Patient Information 2014 [employer].  

## 2012-11-23 NOTE — Progress Notes (Signed)
Patient ID: Renee Coleman    DOB: 04-22-1980, 32 y.o.   MRN: [id number] --- Subjective:  Renee Coleman is a 32 y.o.female who presents to discuss pre-conception care.  She is 32 years old and plans on getting pregnant in the next 2 years with her long term boyfriend. She would like to know the complications of getting pregnant at an older age.  She would also like to know how soon she needs to stop birth control pills and what she needs to do before getting pregnant.  She has been on OCP's for 2 years. Prior to that, she used to have regular periods. She has never been pregnant before. She is in a stable relationship with one female partner and they both wish to get pregnant in the next couple of years.   She denies any significant past medical history. She doesn't smoke. She drinks alcohol occasionally. She doesn't use illicit drugs.    ROS: see HPI Past Medical History: reviewed and updated medications and allergies. Social History: Tobacco: none  Objective: Filed Vitals:   11/21/12 1444  BP: 138/84  Pulse: 69    Physical Examination:   General appearance - alert, well appearing, and in no distress Chest - clear to auscultation, no wheezes, rales or rhonchi, symmetric air entry Heart - normal rate, regular rhythm, normal S1, S2, no murmurs

## 2012-11-24 DIAGNOSIS — Z3169 Encounter for other general counseling and advice on procreation: Secondary | ICD-10-CM | POA: Insufficient documentation

## 2012-11-24 MED ORDER — FLUCONAZOLE 150 MG PO TABS: ORAL_TABLET | ORAL | Status: DC

## 2012-11-24 NOTE — Assessment & Plan Note (Signed)
Discussed risks associated with increased maternal age:  - chromosomal abnormality including Down's and trisomy 38 and 40.  - increased risk of pre-eclampsia and high blood pressure during pregnancy - follow up letter was sent with information about additional risks that may be increased later in age: gestational diabetes, preterm delivery and miscarriages - encouraged her to quit OCP's at least 1 year prior to wanting to become pregnant - encouraged starting a prenatal vitamin as well as to continue with healthy lifestyle including: no smoking, no drinking and no illicit drug use. Also encouraged good dental hygiene with regular dentist appointments as good oral health is important in pregnancy.

## 2012-12-26 ENCOUNTER — Other Ambulatory Visit: Payer: Self-pay | Admitting: Family Medicine

## 2013-04-13 ENCOUNTER — Ambulatory Visit (INDEPENDENT_AMBULATORY_CARE_PROVIDER_SITE_OTHER): Payer: BC Managed Care – PPO | Admitting: Family Medicine

## 2013-04-13 ENCOUNTER — Encounter: Payer: Self-pay | Admitting: Family Medicine

## 2013-04-13 ENCOUNTER — Other Ambulatory Visit (HOSPITAL_COMMUNITY)
Admission: RE | Admit: 2013-04-13 | Discharge: 2013-04-13 | Disposition: A | Discharge status: Home / Self Care | Payer: BC Managed Care – PPO | Source: Ambulatory Visit | Attending: Family Medicine | Admitting: Family Medicine

## 2013-04-13 VITALS — BP 133/89 | HR 68 | Temp 98.4°F | Ht 60.0 in | Wt 124.0 lb

## 2013-04-13 DIAGNOSIS — N939 Abnormal uterine and vaginal bleeding, unspecified: Secondary | ICD-10-CM

## 2013-04-13 DIAGNOSIS — N898 Other specified noninflammatory disorders of vagina: Secondary | ICD-10-CM

## 2013-04-13 DIAGNOSIS — Z113 Encounter for screening for infections with a predominantly sexual mode of transmission: Secondary | ICD-10-CM | POA: Insufficient documentation

## 2013-04-13 LAB — POCT WET PREP (WET MOUNT): Clue Cells Wet Prep Whiff POC: NEGATIVE

## 2013-04-13 NOTE — Progress Notes (Signed)
Patient ID: Renee Coleman    DOB: 10/15/1980, 33 y.o.   MRN: [id number] --- Subjective:  Renee Coleman is a 33 y.o.female who presents with vaginal discharge.  Brownish discharge that started last Thursday until yesterday. No odor, no itching, no vaginal pain. No abdominal pain, no nausea, no vomiting. She is on OCP's. No missed doses. She had a similar episode in September and brown spotting resolved on its own. She is sexually active. She doesn't use condoms. She has low concern for STDs but would still be interested in getting screened for HIV.   ROS: see HPI Past Medical History: reviewed and updated medications and allergies. Social History: Tobacco: none  Objective: Filed Vitals:   04/13/13 0942  BP: 133/89  Pulse: 68  Temp: 98.4 F (36.9 C)    Physical Examination:   General appearance - alert, well appearing, and in no distress Pelvic exam: normal external genitalia, vulva, vagina, cervix, uterus and adnexa, scant whitish discharge, no blood Abdomen - soft, non tender, non distended

## 2013-04-13 NOTE — Assessment & Plan Note (Addendum)
Spotting vs discharge. Currently on OCP's. Cervix appears normal.  - check wet prep and GC/Chl - UPT pending - HIV/RPR checked - reviewed PAP smear results from October 2013 after patient had left clinic. 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 Tried calling patient to discuss this with her and instruct her to return to clinic for well woman exam including pap smear, but did not get answer. Will call back as soon as possible.

## 2013-04-13 NOTE — Patient Instructions (Signed)
I will call you with the results of the labs.  This could very well be from spotting from your birth control pill.

## 2013-04-14 ENCOUNTER — Other Ambulatory Visit: Payer: Self-pay | Admitting: Family Medicine

## 2013-04-14 ENCOUNTER — Telehealth: Payer: Self-pay | Admitting: Family Medicine

## 2013-04-14 LAB — HIV ANTIBODY (ROUTINE TESTING W REFLEX): HIV: NONREACTIVE

## 2013-04-14 LAB — RPR

## 2013-04-14 MED ORDER — FLUCONAZOLE 150 MG PO TABS: ORAL_TABLET | ORAL | Status: DC

## 2013-04-14 NOTE — Telephone Encounter (Signed)
Called patient and left message asking her to call back. When patient calls, please page me at [phone] so that I can speak with her directly.  Thank you!  Liam Graham, PGY-3 Family Medicine Resident

## 2013-04-14 NOTE — Telephone Encounter (Signed)
Called patient back to let her know that she has a yeast infection. Will treat with diflucan x3 tabs.  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  I also let her know that the urine pregnancy test was unfortunately not run when she was here and so when she comes to the clinic next week, we will need to repeat the pregnancy test.  Patient expressed understanding and agreed with plan.   Liam Graham, PGY-3 Family Medicine Resident

## 2013-04-19 ENCOUNTER — Other Ambulatory Visit (HOSPITAL_COMMUNITY)
Admission: RE | Admit: 2013-04-19 | Discharge: 2013-04-19 | Disposition: A | Discharge status: Home / Self Care | Payer: BC Managed Care – PPO | Source: Ambulatory Visit | Attending: Family Medicine | Admitting: Family Medicine

## 2013-04-19 ENCOUNTER — Encounter: Payer: Self-pay | Admitting: Family Medicine

## 2013-04-19 ENCOUNTER — Ambulatory Visit (INDEPENDENT_AMBULATORY_CARE_PROVIDER_SITE_OTHER): Payer: BC Managed Care – PPO | Admitting: Family Medicine

## 2013-04-19 VITALS — BP 143/91 | HR 60 | Ht 60.0 in | Wt 123.0 lb

## 2013-04-19 DIAGNOSIS — B977 Papillomavirus as the cause of diseases classified elsewhere: Secondary | ICD-10-CM | POA: Insufficient documentation

## 2013-04-19 DIAGNOSIS — Z309 Encounter for contraceptive management, unspecified: Secondary | ICD-10-CM

## 2013-04-19 DIAGNOSIS — Z1151 Encounter for screening for human papillomavirus (HPV): Secondary | ICD-10-CM | POA: Insufficient documentation

## 2013-04-19 DIAGNOSIS — Z3169 Encounter for other general counseling and advice on procreation: Secondary | ICD-10-CM

## 2013-04-19 DIAGNOSIS — Z01419 Encounter for gynecological examination (general) (routine) without abnormal findings: Secondary | ICD-10-CM | POA: Insufficient documentation

## 2013-04-19 DIAGNOSIS — N63 Unspecified lump in unspecified breast: Secondary | ICD-10-CM | POA: Insufficient documentation

## 2013-04-19 NOTE — Patient Instructions (Signed)
I will call you with the results of the PAP smear and the mammogram.

## 2013-04-19 NOTE — Assessment & Plan Note (Signed)
Left breast mass on exam which could very well be from fibrocystic changes, but it is larger than the rest and would like to better qualify it with diagnostic mammogram and ultrasound.

## 2013-04-19 NOTE — Assessment & Plan Note (Signed)
Repeated PAP smear today

## 2013-04-19 NOTE — Progress Notes (Signed)
Patient ID: Renee Coleman    DOB: 03-15-1980, 33 y.o.   MRN: [id number] --- Subjective:  Renee Coleman is a 32 y.o.female who presents for well woman exam.  She had an HPV high risk PAP in October 2013 and is here for follow up. She denies any other abnormal PAP smears.  She is sexually active. Doesn't wear condoms. Uses OCP for birth control. LMP: 04/13/13 No h/o STD's No surgeries Family History: maternal grandmother died of breast CA, diagnosed at 34yo.  Patient had an episode of brown discharge last week and was found to have a yeast infection. Since then, the discharge has resolved and she had a normal period.  She denies any abnormal discharge, any vaginal itching or burning. Denies any breast lumps or bumps or nipple discharge.She denies any dysuria, polyuria. She denies any constipation or diarrhea.   ROS: see HPI Past Medical History: reviewed and updated medications and allergies. Social History: Tobacco: none  Objective: Filed Vitals:   04/19/13 1018  BP: 143/91  Pulse: 60    Physical Examination:   General appearance - alert, well appearing, and in no distress Pelvic exam: normal external genitalia, vulva, vagina, cervix, uterus and adnexa. Breast - fibrocystic changes in right breast Left breast: 1cm round mass, mobile, larger than adjacent cystic changes, non tender, located in left breast: lateral lower corner. No axillary lymphadenopathy.

## 2013-04-21 ENCOUNTER — Telehealth: Payer: Self-pay | Admitting: Family Medicine

## 2013-04-21 NOTE — Telephone Encounter (Signed)
Reviewed PAP results but HPV cotest was missing. Called cytology and asked for HPV add on which should be possible.  Will fax back an add on form to the cytology lab once we receive it here from them  Liam Graham, PGY-3 Family Medicine Resident

## 2013-04-24 ENCOUNTER — Other Ambulatory Visit: Payer: Self-pay | Admitting: Family Medicine

## 2013-04-24 ENCOUNTER — Other Ambulatory Visit: Payer: Self-pay

## 2013-04-24 DIAGNOSIS — N63 Unspecified lump in unspecified breast: Secondary | ICD-10-CM

## 2013-04-26 ENCOUNTER — Encounter: Payer: Self-pay | Admitting: Family Medicine

## 2013-04-26 ENCOUNTER — Telehealth: Payer: Self-pay | Admitting: Family Medicine

## 2013-04-26 NOTE — Telephone Encounter (Signed)
Called patient to let her know that PAP was normal with no high risk HPV. Routine screening advised: repeat in 3 years.  Patient expressed understanding and agreed with plan.   Liam Graham, PGY-3 Family Medicine Resident

## 2013-04-28 ENCOUNTER — Telehealth: Payer: Self-pay | Admitting: *Deleted

## 2013-04-28 NOTE — Telephone Encounter (Signed)
Renee Coleman from [facility] [phone]) called.  Pt has appt on Monday 05/01/2013.  She states she needs MD to sign off on the orders and that a message has been sent to MD in EPIC to do.  Will fwd to MD.  Lazaro Arms, CMA

## 2013-05-01 ENCOUNTER — Ambulatory Visit
Admission: RE | Admit: 2013-05-01 | Discharge: 2013-05-01 | Disposition: A | Discharge status: Home / Self Care | Payer: BC Managed Care – PPO | Source: Ambulatory Visit | Attending: Family Medicine | Admitting: Family Medicine

## 2013-05-01 DIAGNOSIS — N63 Unspecified lump in unspecified breast: Secondary | ICD-10-CM

## 2013-05-01 NOTE — Telephone Encounter (Signed)
Called breast center this am and they received order signed by another resident/physician from [facility].   Liam Graham, PGY-3 Family Medicine Resident

## 2013-05-09 ENCOUNTER — Other Ambulatory Visit: Payer: Self-pay | Admitting: *Deleted

## 2013-05-10 ENCOUNTER — Other Ambulatory Visit: Payer: Self-pay | Admitting: *Deleted

## 2013-05-10 MED ORDER — NORGESTIM-ETH ESTRAD TRIPHASIC 0.18/0.215/0.25 MG-35 MCG PO TABS: ORAL_TABLET | ORAL | Status: DC

## 2013-09-28 ENCOUNTER — Other Ambulatory Visit: Payer: Self-pay | Admitting: *Deleted

## 2013-09-28 MED ORDER — NORGESTIM-ETH ESTRAD TRIPHASIC 0.18/0.215/0.25 MG-35 MCG PO TABS: ORAL_TABLET | ORAL | Status: DC

## 2013-10-10 ENCOUNTER — Other Ambulatory Visit: Payer: Self-pay | Admitting: Family Medicine

## 2013-10-10 DIAGNOSIS — R922 Inconclusive mammogram: Secondary | ICD-10-CM

## 2013-10-10 DIAGNOSIS — N649 Disorder of breast, unspecified: Secondary | ICD-10-CM

## 2013-10-18 ENCOUNTER — Telehealth: Payer: Self-pay | Admitting: Family Medicine

## 2013-10-18 ENCOUNTER — Other Ambulatory Visit: Payer: Self-pay | Admitting: Family Medicine

## 2013-10-18 ENCOUNTER — Other Ambulatory Visit: Payer: Self-pay

## 2013-10-18 DIAGNOSIS — N649 Disorder of breast, unspecified: Secondary | ICD-10-CM

## 2013-10-18 DIAGNOSIS — R922 Inconclusive mammogram: Secondary | ICD-10-CM

## 2013-10-18 NOTE — Telephone Encounter (Signed)
[facility] called and need the PCP or nurse to sign off on orders in Epic for the f/u on patient. jw

## 2013-10-19 ENCOUNTER — Ambulatory Visit
Admission: RE | Admit: 2013-10-19 | Discharge: 2013-10-19 | Disposition: A | Discharge status: Home / Self Care | Payer: BC Managed Care – PPO | Source: Ambulatory Visit | Attending: Family Medicine | Admitting: Family Medicine

## 2013-10-19 DIAGNOSIS — R922 Inconclusive mammogram: Secondary | ICD-10-CM

## 2013-10-19 DIAGNOSIS — N649 Disorder of breast, unspecified: Secondary | ICD-10-CM

## 2013-10-19 NOTE — Telephone Encounter (Signed)
Please clarify with pt / [place] orders need to be signed? Does she need a test ordered, or a referral, or .[name].? --CMS

## 2013-10-19 NOTE — Telephone Encounter (Signed)
Patient has a follow up breast ultrasound that needs a co-signature. It looks like the original message for co-signature was sent to Dr. Lindell Noe.

## 2013-10-19 NOTE — Telephone Encounter (Signed)
Paper orders signed and faxed back to [place]. Thanks! --CMS

## 2014-02-16 ENCOUNTER — Other Ambulatory Visit: Payer: Self-pay | Admitting: Family Medicine

## 2014-04-17 ENCOUNTER — Other Ambulatory Visit: Payer: Self-pay | Admitting: Family Medicine

## 2014-04-17 DIAGNOSIS — N63 Unspecified lump in unspecified breast: Secondary | ICD-10-CM

## 2014-05-07 ENCOUNTER — Ambulatory Visit
Admission: RE | Admit: 2014-05-07 | Discharge: 2014-05-07 | Disposition: A | Discharge status: Home / Self Care | Payer: BC Managed Care – PPO | Source: Ambulatory Visit | Attending: Family Medicine | Admitting: Family Medicine

## 2014-05-07 ENCOUNTER — Other Ambulatory Visit: Payer: BC Managed Care – PPO

## 2014-05-07 DIAGNOSIS — N63 Unspecified lump in unspecified breast: Secondary | ICD-10-CM

## 2014-08-16 ENCOUNTER — Encounter: Payer: Self-pay | Admitting: Family Medicine

## 2014-08-16 ENCOUNTER — Ambulatory Visit (INDEPENDENT_AMBULATORY_CARE_PROVIDER_SITE_OTHER): Payer: BC Managed Care – PPO | Admitting: Family Medicine

## 2014-08-16 VITALS — BP 127/86 | HR 95 | Temp 98.5°F | Ht 60.0 in | Wt 126.9 lb

## 2014-08-16 DIAGNOSIS — N912 Amenorrhea, unspecified: Secondary | ICD-10-CM

## 2014-08-16 DIAGNOSIS — Z331 Pregnant state, incidental: Secondary | ICD-10-CM | POA: Diagnosis not present

## 2014-08-16 LAB — POCT URINE PREGNANCY: Preg Test, Ur: POSITIVE

## 2014-08-16 NOTE — Progress Notes (Signed)
   Subjective:    Patient ID: Renee Coleman, female    DOB: 1980-11-10, 34 y.o.   MRN: [id number]  HPI: Pt presents to clinic for SDA with concern she is pregnant; she reports she had a positive pregnancy test at home a couple of days ago. Her last period started on May 6th, her last intercourse was about 1 week ago (with one stable female partner to whom she is getting married next week), and she stopped taking birth control in April. This was an intentional pregnancy. She has essentially no unusual symptoms other than some "occasional cramping" which she attributes to bowel-type discomfort. She has no bleeding, vaginal discharge, urinary discomfort, or change in bowel habits. She drinks wine "rarely" (last was 2-3 weeks ago) and she does not smoke. She has no known long-term medical issues and reports she takes no regular medications. She has no pets.  Of note, pt states she would like a confirmatory pregnancy test, today. She would like her prenatal care managed by an OBGYN but she does not have one, currently  Review of Systems: As above.     Objective:   Physical Exam BP 127/86 mmHg  Pulse 95  Temp(Src) 98.5 F (36.9 C) (Oral)  Ht 5' (1.524 m)  Wt 126 lb 14.4 oz (57.561 kg)  BMI 24.78 kg/m2  LMP 07/13/2014 Gen: well-appearing adult female in NAD HEENT: [place]/AT, EOMI, PERRLA, MMM, TM's clear bilaterally  Nasal mucosae and posterior oropharynx clear Neck: supple, normal ROM, no lymphadenopathy Cardio: RRR, no murmur appreciated Pulm: CTAB, no wheezes, normal WOB Abd: soft, nontender, BS+ Ext: warm, well-perfused, no LE edema  Urine pregnancy test: POSITIVE     Assessment & Plan:  34yo female with incidental pregnancy; desires prenatal management by OBGYN - Rx provided for daily PNV and advised to start them immediately - advised pt on first-trimester expectations and provided handout - counseled on dietary avoidance of shellfish, sushi, EtOH, unpasteurized dairy products -  advised to avoid handling cat litter - recommended avoiding NSAIDs and using Tylenol fo any pain / headache / etc - reviewed red flags that would prompt immediate presentation to [facility] at [place] - defer labs (to avoid duplicates from OBGYN) or imaging (given no worrisome symptoms suggesting ectopic pregnancy) at this time - advised pt to contact an OBGYN office of her choosing (after finding an in-network provider from her insurance carrier) and to call to ask for formal referral if required - counseled that if she chooses she can follow here at [facility] for prenatal care but to contact us sooner rather than later if she chooses [place], so prenatal labs and initial visit can be scheduled - f/u with me as her PCP as needed, otherwise (instructed pt that she will have a new PCP after July 1st)  Emmaline Kluver, MD PGY-3, [facility] 08/16/2014, 8:13 PM

## 2014-08-16 NOTE — Patient Instructions (Signed)
Thank you for coming in, today!  Congratulations on your pregnancy! Call your insurance company to see who is in-network for you. Call them to set up an appointment for initial prenatal care. If they need a referral from me, call me and let me know specifically who to refer you to (practice and doctor names).  Start taking prenatal vitamins. Take one a day every day. Avoid shellfish and sushi. Do not drink alcohol. Avoid unpasteurized milks / cheeses or aged cheeses. Otherwise there aren't many food restrictions.  Call or come back here with any questions or concerns. If you have any bad cramping pain, vomiting, bleeding or discharge, or other new symptoms, call here or go to [facility]. My last day is June 30th but I'll take care of anything until then.  Please feel free to call with any questions or concerns at any time, at [phone]. --Dr. Venetia Maxon  First Trimester of Pregnancy The first trimester of pregnancy is from week 1 until the end of week 12 (months 1 through 3). During this time, your baby will begin to develop inside you. At 6-8 weeks, the eyes and face are formed, and the heartbeat can be seen on ultrasound. At the end of 12 weeks, all the baby's organs are formed. Prenatal care is all the medical care you receive before the birth of your baby. Make sure you get good prenatal care and follow all of your doctor's instructions. HOME CARE  Medicines  Take medicine only as told by your doctor. Some medicines are safe and some are not during pregnancy.  Take your prenatal vitamins as told by your doctor.  Take medicine that helps you poop (stool softener) as needed if your doctor says it is okay. Diet  Eat regular, healthy meals.  Your doctor will tell you the amount of weight gain that is right for you.  Avoid raw meat and uncooked cheese.  If you feel sick to your stomach (nauseous) or throw up (vomit):  Eat 4 or 5 small meals a day instead of 3 large  meals.  Try eating a few soda crackers.  Drink liquids between meals instead of during meals.  If you have a hard time pooping (constipation):  Eat high-fiber foods like fresh vegetables, fruit, and whole grains.  Drink enough fluids to keep your pee (urine) clear or pale yellow. Activity and Exercise  Exercise only as told by your doctor. Stop exercising if you have cramps or pain in your lower belly (abdomen) or low back.  Try to avoid standing for long periods of time. Move your legs often if you must stand in one place for a long time.  Avoid heavy lifting.  Wear low-heeled shoes. Sit and stand up straight.  You can have sex unless your doctor tells you not to. Relief of Pain or Discomfort  Wear a good support bra if your breasts are sore.  Take warm water baths (sitz baths) to soothe pain or discomfort caused by hemorrhoids. Use hemorrhoid cream if your doctor says it is okay.  Rest with your legs raised if you have leg cramps or low back pain.  Wear support hose if you have puffy, bulging veins (varicose veins) in your legs. Raise (elevate) your feet for 15 minutes, 3-4 times a day. Limit salt in your diet. Prenatal Care  Schedule your prenatal visits by the twelfth week of pregnancy.  Write down your questions. Take them to your prenatal visits.  Keep all your prenatal visits as told  by your doctor. Safety  Wear your seat belt at all times when driving.  Make a list of emergency phone numbers. The list should include numbers for family, friends, the hospital, and police and fire departments. General Tips  Ask your doctor for a referral to a local prenatal class. Begin classes no later than at the start of month 6 of your pregnancy.  Ask for help if you need counseling or help with nutrition. Your doctor can give you advice or tell you where to go for help.  Do not use hot tubs, steam rooms, or saunas.  Do not douche or use tampons or scented sanitary  pads.  Do not cross your legs for long periods of time.  Avoid litter boxes and soil used by cats.  Avoid all smoking, herbs, and alcohol. Avoid drugs not approved by your doctor.  Visit your dentist. At home, brush your teeth with a soft toothbrush. Be gentle when you floss. GET HELP IF:  You are dizzy.  You have mild cramps or pressure in your lower belly.  You have a nagging pain in your belly area.  You continue to feel sick to your stomach, throw up, or have watery poop (diarrhea).  You have a bad smelling fluid coming from your vagina.  You have pain with peeing (urination).  You have increased puffiness (swelling) in your face, hands, legs, or ankles. GET HELP RIGHT AWAY IF:   You have a fever.  You are leaking fluid from your vagina.  You have spotting or bleeding from your vagina.  You have very bad belly cramping or pain.  You gain or lose weight rapidly.  You throw up blood. It may look like coffee grounds.  You are around people who have [place] measles, fifth disease, or chickenpox.  You have a very bad headache.  You have shortness of breath.  You have any kind of trauma, such as from a fall or a car accident. Document Released: 08/12/2007 Document Revised: 07/10/2013 Document Reviewed: 01/03/2013 [facility] Patient Information 2015 [place], [place]. This information is not intended to replace advice given to you by your health care provider. Make sure you discuss any questions you have with your health care provider.

## 2014-09-12 LAB — OB RESULTS CONSOLE RPR: RPR: NONREACTIVE

## 2014-09-12 LAB — OB RESULTS CONSOLE RUBELLA ANTIBODY, IGM: RUBELLA: IMMUNE

## 2014-09-12 LAB — OB RESULTS CONSOLE GC/CHLAMYDIA
CHLAMYDIA, DNA PROBE: NEGATIVE
GC PROBE AMP, GENITAL: NEGATIVE

## 2014-09-12 LAB — OB RESULTS CONSOLE HEPATITIS B SURFACE ANTIGEN: Hepatitis B Surface Ag: NEGATIVE

## 2014-09-12 LAB — OB RESULTS CONSOLE HIV ANTIBODY (ROUTINE TESTING): HIV: NONREACTIVE

## 2014-09-16 ENCOUNTER — Encounter (HOSPITAL_COMMUNITY): Payer: Self-pay

## 2014-09-16 ENCOUNTER — Inpatient Hospital Stay (HOSPITAL_COMMUNITY)
Admission: AD | Admit: 2014-09-16 | Discharge: 2014-09-16 | Disposition: A | Discharge status: Home / Self Care | Payer: BC Managed Care – PPO | Source: Ambulatory Visit | Attending: Obstetrics and Gynecology | Admitting: Obstetrics and Gynecology

## 2014-09-16 DIAGNOSIS — Z3A09 9 weeks gestation of pregnancy: Secondary | ICD-10-CM | POA: Insufficient documentation

## 2014-09-16 DIAGNOSIS — R109 Unspecified abdominal pain: Secondary | ICD-10-CM | POA: Insufficient documentation

## 2014-09-16 DIAGNOSIS — D259 Leiomyoma of uterus, unspecified: Secondary | ICD-10-CM | POA: Diagnosis not present

## 2014-09-16 DIAGNOSIS — O9989 Other specified diseases and conditions complicating pregnancy, childbirth and the puerperium: Secondary | ICD-10-CM | POA: Diagnosis not present

## 2014-09-16 DIAGNOSIS — K5900 Constipation, unspecified: Secondary | ICD-10-CM | POA: Diagnosis not present

## 2014-09-16 DIAGNOSIS — O26899 Other specified pregnancy related conditions, unspecified trimester: Secondary | ICD-10-CM

## 2014-09-16 DIAGNOSIS — K5901 Slow transit constipation: Secondary | ICD-10-CM

## 2014-09-16 DIAGNOSIS — O3411 Maternal care for benign tumor of corpus uteri, first trimester: Secondary | ICD-10-CM | POA: Diagnosis not present

## 2014-09-16 HISTORY — DX: Other specified health status: Z78.9

## 2014-09-16 LAB — URINE MICROSCOPIC-ADD ON

## 2014-09-16 LAB — URINALYSIS, ROUTINE W REFLEX MICROSCOPIC
Bilirubin Urine: NEGATIVE
Glucose, UA: NEGATIVE mg/dL
Ketones, ur: 15 mg/dL — AB
LEUKOCYTES UA: NEGATIVE
Nitrite: NEGATIVE
PROTEIN: NEGATIVE mg/dL
SPECIFIC GRAVITY, URINE: 1.01 (ref 1.005–1.030)
UROBILINOGEN UA: 1 mg/dL (ref 0.0–1.0)
pH: 6.5 (ref 5.0–8.0)

## 2014-09-16 MED ORDER — LABETALOL HCL 5 MG/ML IV SOLN: 20.0000 mg | INTRAVENOUS | Status: DC | PRN

## 2014-09-16 MED ORDER — HYDRALAZINE HCL 20 MG/ML IJ SOLN: 10.0000 mg | Freq: Once | INTRAMUSCULAR | Status: DC | PRN

## 2014-09-16 MED ORDER — POLYETHYLENE GLYCOL 3350 17 GM/SCOOP PO POWD
ORAL | Status: DC
Start: 2014-09-16 — End: 2014-11-27

## 2014-09-16 NOTE — MAU Provider Note (Signed)
History     CSN: [id number]  Arrival date and time: 09/16/14 6440   First Provider Initiated Contact with Patient 09/16/14 [phone]      Chief Complaint  Patient presents with  . Abdominal Pain   HPI  Ms. Joss Friedel is a 34 y.o. G1P0 at [redacted]w[redacted]d who presents to MAU today with complaint of abdominal pain since yesterday. She states pain is sharp and intermittent and radiates across the lower abdomen. She also endorses low back pain. She states pain is 9/10 when it is sharp, but now has no pain. She last took Tylenol on Thursday with minimal relief. The patient has had IUP confirmed with [place] in the office per patient. She states multiple large uterine fibroids as well. She also endorses recent constipation. She denies N/V/D, UTI symptoms, vaginal bleeding or abnormal discharge.   OB History    Gravida Para Term Preterm AB TAB SAB Ectopic Multiple Living   1               Past Medical History  Diagnosis Date  . Medical history non-contributory     Past Surgical History  Procedure Laterality Date  . No past surgeries      History reviewed. No pertinent family history.  History  Substance Use Topics  . Smoking status: Never Smoker   . Smokeless tobacco: Never Used  . Alcohol Use: 0.6 oz/week    1 Glasses of wine per week     Comment: 1-2 drinks per day before preg    Allergies: No Known Allergies  Prescriptions prior to admission  Medication Sig Dispense Refill Last Dose  . acetaminophen (TYLENOL) 325 MG tablet Take 650 mg by mouth every 6 (six) hours as needed.   Past Week at Unknown time  . Prenatal Vit-Fe Fumarate-FA (PRENATAL MULTIVITAMIN) TABS tablet Take 1 tablet by mouth daily at 12 noon.   09/15/2014 at Unknown time  . fluconazole (DIFLUCAN) 150 MG tablet Take 1 tab every 72hrs for 3 doses. 3 tablet 0   . TRI-PREVIFEM 0.18/0.215/0.25 MG-35 MCG tablet TAKE 1 TABLET BY MOUTH ONCE DAILY 28 tablet 11     Review of Systems  Constitutional: Negative for fever and  malaise/fatigue.  Gastrointestinal: Positive for abdominal pain and constipation. Negative for nausea, vomiting and diarrhea.  Genitourinary: Negative for dysuria, urgency and frequency.       Neg - vaginal bleeding, discharge   Physical Exam   Blood pressure 119/73, pulse 93, temperature 98 F (36.7 C), temperature source Oral, resp. rate 16, height 5' (1.524 m), weight 122 lb (55.339 kg), last menstrual period 07/13/2014, SpO2 99 %.  Physical Exam  Nursing note and vitals reviewed. Constitutional: She is oriented to person, place, and time. She appears well-developed and well-nourished. No distress.  HENT:  Head: Normocephalic and atraumatic.  Cardiovascular: Normal rate.   Respiratory: Effort normal.  GI: Soft. She exhibits mass (noteable fibroids palpable to just below the umbilicus). She exhibits no distension. There is no tenderness. There is no rebound and no guarding.  Neurological: She is alert and oriented to person, place, and time.  Skin: Skin is warm and dry. No erythema.  Psychiatric: She has a normal mood and affect.   Results for orders placed or performed during the hospital encounter of 09/16/14 (from the past 24 hour(s))  Urinalysis, Routine w reflex microscopic (not at [facility])     Status: Abnormal   Collection Time: 09/16/14  6:09 AM  Result Value Ref Range   Color,  Urine YELLOW YELLOW   APPearance CLEAR CLEAR   Specific Gravity, Urine 1.010 1.005 - 1.030   pH 6.5 5.0 - 8.0   Glucose, UA NEGATIVE NEGATIVE mg/dL   Hgb urine dipstick TRACE (A) NEGATIVE   Bilirubin Urine NEGATIVE NEGATIVE   Ketones, ur 15 (A) NEGATIVE mg/dL   Protein, ur NEGATIVE NEGATIVE mg/dL   Urobilinogen, UA 1.0 0.0 - 1.0 mg/dL   Nitrite NEGATIVE NEGATIVE   Leukocytes, UA NEGATIVE NEGATIVE  Urine microscopic-add on     Status: Abnormal   Collection Time: 09/16/14  6:09 AM  Result Value Ref Range   Squamous Epithelial / LPF FEW (A) RARE   WBC, UA 0-2 <3 WBC/hpf   Bacteria, UA FEW (A) RARE    Urine-Other MUCOUS PRESENT     MAU Course  Procedures None  MDM FHR - 177 bpm Discussed with Dr. Julien Girt. Recommends treatment of constipation. Follow-up in the office as scheduled Discussed options with patient for Miralax/Colace at home vs Enema in MAU. She would prefer to try home medications first.   Assessment and Plan  A: SIUP at [redacted]w[redacted]d Fibroid uterus Abdominal pain in pregnancy Constipation  P: Discharge home Rx for Miralax given to patient First trimester precautions discussed Patient advised to follow-up with [facility] as scheduled for routine prenatal care or sooner PRN Patient may return to MAU as needed or if her condition were to change or worsen   Luvenia Redden, PA-C  09/16/2014, 6:46 AM

## 2014-09-16 NOTE — MAU Note (Signed)
Since yesterday, having sharp pains on left lower abd and shoots across abd, comes and goes. No bleeding. Tailbone hurts also.  This week having constipation, going small amounts, hard stools.

## 2014-09-16 NOTE — Discharge Instructions (Signed)
Fibroids Fibroids are lumps (tumors) that can occur any place in a woman's body. These lumps are not cancerous. Fibroids vary in size, weight, and where they grow. HOME CARE  Do not take aspirin.  Write down the number of pads or tampons you use during your period. Tell your doctor. This can help determine the best treatment for you. GET HELP RIGHT AWAY IF:  You have pain in your lower belly (abdomen) that is not helped with medicine.  You have cramps that are not helped with medicine.  You have more bleeding between or during your period.  You feel lightheaded or pass out (faint).  Your lower belly pain gets worse. MAKE SURE YOU:  Understand these instructions.  Will watch your condition.  Will get help right away if you are not doing well or get worse. Document Released: 03/28/2010 Document Revised: 05/18/2011 Document Reviewed: 03/28/2010 [facility] Patient Information 2015 [place], [place]. This information is not intended to replace advice given to you by your health care provider. Make sure you discuss any questions you have with your health care provider. Constipation Constipation is when a person:  Poops (has a bowel movement) less than 3 times a week.  Has a hard time pooping.  Has poop that is dry, hard, or bigger than normal. HOME CARE   Eat foods with a lot of fiber in them. This includes fruits, vegetables, beans, and whole grains such as brown rice.  Avoid fatty foods and foods with a lot of sugar. This includes french fries, hamburgers, cookies, candy, and soda.  If you are not getting enough fiber from food, take products with added fiber in them (supplements).  Drink enough fluid to keep your pee (urine) clear or pale yellow.  Exercise on a regular basis, or as told by your doctor.  Go to the restroom when you feel like you need to poop. Do not hold it.  Only take medicine as told by your doctor. Do not take medicines that help you poop (laxatives)  without talking to your doctor first. GET HELP RIGHT AWAY IF:   You have bright red blood in your poop (stool).  Your constipation lasts more than 4 days or gets worse.  You have belly (abdominal) or butt (rectal) pain.  You have thin poop (as thin as a pencil).  You lose weight, and it cannot be explained. MAKE SURE YOU:   Understand these instructions.  Will watch your condition.  Will get help right away if you are not doing well or get worse. Document Released: 08/12/2007 Document Revised: 02/28/2013 Document Reviewed: 12/05/2012 [facility] Patient Information 2015 [place], [place]. This information is not intended to replace advice given to you by your health care provider. Make sure you discuss any questions you have with your health care provider.

## 2014-09-24 ENCOUNTER — Other Ambulatory Visit: Payer: Self-pay | Admitting: Obstetrics and Gynecology

## 2014-09-25 LAB — CYTOLOGY - PAP

## 2014-10-02 ENCOUNTER — Other Ambulatory Visit: Payer: Self-pay | Admitting: Obstetrics and Gynecology

## 2014-10-02 DIAGNOSIS — N6320 Unspecified lump in the left breast, unspecified quadrant: Secondary | ICD-10-CM

## 2014-11-05 ENCOUNTER — Inpatient Hospital Stay (HOSPITAL_COMMUNITY)
Admission: AD | Admit: 2014-11-05 | Discharge: 2014-11-06 | Disposition: A | Discharge status: Home / Self Care | Payer: BC Managed Care – PPO | Source: Ambulatory Visit | Attending: Obstetrics and Gynecology | Admitting: Obstetrics and Gynecology

## 2014-11-05 DIAGNOSIS — O98812 Other maternal infectious and parasitic diseases complicating pregnancy, second trimester: Secondary | ICD-10-CM | POA: Diagnosis not present

## 2014-11-05 DIAGNOSIS — B9689 Other specified bacterial agents as the cause of diseases classified elsewhere: Secondary | ICD-10-CM | POA: Diagnosis not present

## 2014-11-05 DIAGNOSIS — Z3A16 16 weeks gestation of pregnancy: Secondary | ICD-10-CM

## 2014-11-05 DIAGNOSIS — B373 Candidiasis of vulva and vagina: Secondary | ICD-10-CM | POA: Diagnosis not present

## 2014-11-05 DIAGNOSIS — R102 Pelvic and perineal pain: Secondary | ICD-10-CM | POA: Insufficient documentation

## 2014-11-05 DIAGNOSIS — O23592 Infection of other part of genital tract in pregnancy, second trimester: Secondary | ICD-10-CM | POA: Insufficient documentation

## 2014-11-05 DIAGNOSIS — B3731 Acute candidiasis of vulva and vagina: Secondary | ICD-10-CM

## 2014-11-05 DIAGNOSIS — O26892 Other specified pregnancy related conditions, second trimester: Secondary | ICD-10-CM | POA: Insufficient documentation

## 2014-11-05 DIAGNOSIS — R1030 Lower abdominal pain, unspecified: Secondary | ICD-10-CM | POA: Diagnosis present

## 2014-11-05 DIAGNOSIS — O26899 Other specified pregnancy related conditions, unspecified trimester: Secondary | ICD-10-CM

## 2014-11-05 NOTE — MAU Note (Signed)
PT  SAYS SHE  LAYED  DOWN  AT  7PM  AND  SHE  FELT PAIN ON HER RIGHT  SIDE .  PAIN COMES/ GOES.  WORSE WHEN  WALKING .   NO MED   FOR PAIN.   DR ADKINS  -  CALLED  OFFICE -  AT 10PM-  TOLD  TO COME   HERE.  LAST SEX-    SAT.

## 2014-11-06 ENCOUNTER — Encounter (HOSPITAL_COMMUNITY): Payer: Self-pay | Admitting: *Deleted

## 2014-11-06 DIAGNOSIS — O98812 Other maternal infectious and parasitic diseases complicating pregnancy, second trimester: Secondary | ICD-10-CM | POA: Diagnosis not present

## 2014-11-06 DIAGNOSIS — B373 Candidiasis of vulva and vagina: Secondary | ICD-10-CM | POA: Diagnosis not present

## 2014-11-06 DIAGNOSIS — Z3A16 16 weeks gestation of pregnancy: Secondary | ICD-10-CM | POA: Diagnosis not present

## 2014-11-06 LAB — WET PREP, GENITAL
CLUE CELLS WET PREP: NONE SEEN
Trich, Wet Prep: NONE SEEN

## 2014-11-06 LAB — URINE MICROSCOPIC-ADD ON

## 2014-11-06 LAB — URINALYSIS, ROUTINE W REFLEX MICROSCOPIC
Bilirubin Urine: NEGATIVE
Glucose, UA: NEGATIVE mg/dL
Ketones, ur: 15 mg/dL — AB
Nitrite: NEGATIVE
Protein, ur: NEGATIVE mg/dL
Specific Gravity, Urine: <1.005 — ABNORMAL LOW (ref 1.005–1.030)
Urobilinogen, UA: 0.2 mg/dL (ref 0.0–1.0)
pH: 6 (ref 5.0–8.0)

## 2014-11-06 MED ORDER — CLOTRIMAZOLE 2 % VA CREA: 1.0000 | TOPICAL_CREAM | Freq: Every day | VAGINAL | Status: DC

## 2014-11-06 NOTE — Discharge Instructions (Signed)
Round Ligament Pain During Pregnancy Round ligament pain is a sharp pain or jabbing feeling often felt in the lower belly or groin area on one or both sides. It is one of the most common complaints during pregnancy and is considered a normal part of pregnancy. It is most often felt during the second trimester.  Here is what you need to know about round ligament pain, including some tips to help you feel better.  Causes of Round Ligament Pain  Several thick ligaments surround and support your womb (uterus) as it grows during pregnancy. One of them is called the round ligament.  The round ligament connects the front part of the womb to your groin, the area where your legs attach to your pelvis. The round ligament normally tightens and relaxes slowly.  As your baby and womb grow, the round ligament stretches. That makes it more likely to become strained.  Sudden movements can cause the ligament to tighten quickly, like a rubber band snapping. This causes a sudden and quick jabbing feeling.  Symptoms of Round Ligament Pain  Round ligament pain can be concerning and uncomfortable. But it is considered normal as your body changes during pregnancy.  The symptoms of round ligament pain include a sharp, sudden spasm in the belly. It usually affects the right side, but it may happen on both sides. The pain only lasts a few seconds.  Exercise may cause the pain, as will rapid movements such as:  sneezing coughing laughing rolling over in bed standing up too quickly  Treatment of Round Ligament Pain  Here are some tips that may help reduce your discomfort:  Pain relief. Take over-the-counter acetaminophen for pain, if necessary. Ask your doctor if this is OK.  Exercise. Get plenty of exercise to keep your stomach (core) muscles strong. Doing stretching exercises or prenatal yoga can be helpful. Ask your doctor which exercises are safe for you and your baby.  A helpful exercise involves  putting your hands and knees on the floor, lowering your head, and pushing your backside into the air.  Avoid sudden movements. Change positions slowly (such as standing up or sitting down) to avoid sudden movements that may cause stretching and pain.  Flex your hips. Bend and flex your hips before you cough, sneeze, or laugh to avoid pulling on the ligaments.  Apply warmth. A heating pad or warm bath may be helpful. Ask your doctor if this is OK. Extreme heat can be dangerous to the baby.  You should try to modify your daily activity level and avoid positions that may worsen the condition.  When to Call the Doctor/Midwife  Always tell your doctor or midwife about any type of pain you have during pregnancy. Round ligament pain is quick and doesn't last long.  Call your health care provider immediately if you have:  severe pain fever chills pain on urination difficulty walking  Belly pain during pregnancy can be due to many different causes. It is important for your doctor to rule out more serious conditions, including pregnancy complications such as placenta abruption or non-pregnancy illnesses such as:  inguinal hernia appendicitis stomach, liver, and kidney problems Preterm labor pains may sometimes be mistaken for round ligament pain.   Yeast Infection of the Skin Some yeast on the skin is normal, but sometimes it causes an infection. If you have a yeast infection, it shows up as white or light brown patches on brown skin. You can see it better in the summer on tan  skin. It causes light-colored holes in your suntan. It can happen on any area of the body. This cannot be passed from person to person. HOME CARE  Scrub your skin daily with a dandruff shampoo. Your rash may take a couple weeks to get well.  Do not scratch or itch the rash. GET HELP RIGHT AWAY IF:   You get another infection from scratching. The skin may get warm, red, and may ooze fluid.  The infection does not  seem to be getting better. MAKE SURE YOU:  Understand these instructions.  Will watch your condition.  Will get help right away if you are not doing well or get worse. Document Released: 02/06/2008 Document Revised: 05/18/2011 Document Reviewed: 02/06/2008 [facility] Patient Information 2015 [place], [place]. This information is not intended to replace advice given to you by your health care provider. Make sure you discuss any questions you have with your health care provider.

## 2014-11-06 NOTE — MAU Provider Note (Signed)
History     CSN: [id number]  Arrival date and time: 11/05/14 2328   First Provider Initiated Contact with Patient 11/06/14 0024      No chief complaint on file.  HPI Comments: Renee Coleman is a 34 y.o. G1P0 at [redacted]w[redacted]d who presents today with lower abdominal pain. She states that the pain started when she woke up from a nap at around 1900. She denies any vaginal bleeding or LOF.   Abdominal Pain This is a new problem. The current episode started today (around 1900 ). The onset quality is sudden. The problem occurs constantly. The problem has been unchanged. The pain is located in the RLQ. The pain is at a severity of 5/10. The quality of the pain is cramping. The abdominal pain does not radiate. Associated symptoms include dysuria ("the right side feels tighter when I urinate"). Pertinent negatives include no constipation, diarrhea, fever, frequency, nausea or vomiting. The pain is aggravated by movement. The pain is relieved by recumbency. She has tried nothing for the symptoms.    Past Medical History  Diagnosis Date  . Medical history non-contributory     Past Surgical History  Procedure Laterality Date  . No past surgeries      History reviewed. No pertinent family history.  Social History  Substance Use Topics  . Smoking status: Never Smoker   . Smokeless tobacco: Never Used  . Alcohol Use: 0.6 oz/week    1 Glasses of wine per week     Comment: 1-2 drinks per day before preg    Allergies: No Known Allergies  Prescriptions prior to admission  Medication Sig Dispense Refill Last Dose  . acetaminophen (TYLENOL) 325 MG tablet Take 650 mg by mouth every 6 (six) hours as needed.   Past Week at Unknown time  . Prenatal Vit-Fe Fumarate-FA (PRENATAL MULTIVITAMIN) TABS tablet Take 1 tablet by mouth daily at 12 noon.   11/05/2014 at Unknown time  . polyethylene glycol powder (GLYCOLAX/MIRALAX) powder 1 capful daily until normal bowel movements resume 255 g 0 More than a month at  Unknown time    Review of Systems  Constitutional: Negative for fever.  Gastrointestinal: Positive for abdominal pain. Negative for nausea, vomiting, diarrhea and constipation.  Genitourinary: Positive for dysuria ("the right side feels tighter when I urinate"). Negative for urgency and frequency.   Physical Exam   Blood pressure 118/75, pulse 73, temperature 98.2 F (36.8 C), temperature source Oral, resp. rate 20, height 5\' 1"  (1.549 m), weight 54.432 kg (120 lb), last menstrual period 07/13/2014.  Physical Exam  Nursing note reviewed. Constitutional: She is oriented to person, place, and time. She appears well-developed. No distress.  HENT:  Head: Normocephalic.  Cardiovascular: Normal rate.   Respiratory: Effort normal.  GI: Soft. There is no tenderness. There is no rebound.  Genitourinary:   External: no lesion Vagina: small amount of white discharge Cervix: pink, smooth, no CMT Uterus: AGA, FHT 152 with doppler    Neurological: She is alert and oriented to person, place, and time.  Skin: Skin is dry.  Psychiatric: She has a normal mood and affect.   Results for orders placed or performed during the hospital encounter of 11/05/14 (from the past 24 hour(s))  Urinalysis, Routine w reflex microscopic (not at [facility])     Status: Abnormal   Collection Time: 11/05/14 11:49 PM  Result Value Ref Range   Color, Urine YELLOW YELLOW   APPearance CLEAR CLEAR   Specific Gravity, Urine <1.005 (L) 1.005 -  1.030   pH 6.0 5.0 - 8.0   Glucose, UA NEGATIVE NEGATIVE mg/dL   Hgb urine dipstick TRACE (A) NEGATIVE   Bilirubin Urine NEGATIVE NEGATIVE   Ketones, ur 15 (A) NEGATIVE mg/dL   Protein, ur NEGATIVE NEGATIVE mg/dL   Urobilinogen, UA 0.2 0.0 - 1.0 mg/dL   Nitrite NEGATIVE NEGATIVE   Leukocytes, UA SMALL (A) NEGATIVE  Urine microscopic-add on     Status: None   Collection Time: 11/05/14 11:49 PM  Result Value Ref Range   Squamous Epithelial / LPF RARE RARE   WBC, UA 0-2 <3  WBC/hpf   RBC / HPF 0-2 <3 RBC/hpf   Bacteria, UA RARE RARE  Wet prep, genital     Status: Abnormal   Collection Time: 11/06/14 12:30 AM  Result Value Ref Range   Yeast Wet Prep HPF POC FEW (A) NONE SEEN   Trich, Wet Prep NONE SEEN NONE SEEN   Clue Cells Wet Prep HPF POC NONE SEEN NONE SEEN   WBC, Wet Prep HPF POC MODERATE (A) NONE SEEN    MAU Course  Procedures  MDM 0347: D/W Dr. Julien Girt, ok for dc home.   Assessment and Plan   1. Yeast infection involving the vagina and surrounding area   2. Pain of round ligament affecting pregnancy, antepartum   3. [redacted] weeks gestation of pregnancy    DC home Comfort measures reviewed  2nd Trimester precautions  RX: terazol 7  Return to MAU as needed FU with OB as planned  Follow-up Information    Follow up with Marylynn Pearson, MD.   Specialty:  Obstetrics and Gynecology   Contact information:   [address], [address] [place] [address] [phone]         Mathis Bud 11/06/2014, 12:26 AM

## 2014-11-07 ENCOUNTER — Ambulatory Visit
Admission: RE | Admit: 2014-11-07 | Discharge: 2014-11-07 | Disposition: A | Discharge status: Home / Self Care | Payer: BC Managed Care – PPO | Source: Ambulatory Visit | Attending: Obstetrics and Gynecology | Admitting: Obstetrics and Gynecology

## 2014-11-07 DIAGNOSIS — N6320 Unspecified lump in the left breast, unspecified quadrant: Secondary | ICD-10-CM

## 2014-11-27 ENCOUNTER — Observation Stay (HOSPITAL_COMMUNITY): Payer: BC Managed Care – PPO

## 2014-11-27 ENCOUNTER — Ambulatory Visit (HOSPITAL_COMMUNITY)
Admit: 2014-11-27 | Discharge: 2014-11-27 | Disposition: A | Discharge status: Home / Self Care | Payer: BC Managed Care – PPO | Attending: Obstetrics and Gynecology | Admitting: Obstetrics and Gynecology

## 2014-11-27 ENCOUNTER — Observation Stay (HOSPITAL_COMMUNITY)
Admission: AD | Admit: 2014-11-27 | Discharge: 2014-11-30 | Disposition: A | Discharge status: Home / Self Care | Payer: BC Managed Care – PPO | Source: Ambulatory Visit | Attending: Obstetrics and Gynecology | Admitting: Obstetrics and Gynecology

## 2014-11-27 ENCOUNTER — Encounter (HOSPITAL_COMMUNITY): Payer: Self-pay | Admitting: *Deleted

## 2014-11-27 DIAGNOSIS — O3432 Maternal care for cervical incompetence, second trimester: Secondary | ICD-10-CM | POA: Diagnosis not present

## 2014-11-27 DIAGNOSIS — Z3A19 19 weeks gestation of pregnancy: Secondary | ICD-10-CM | POA: Diagnosis not present

## 2014-11-27 DIAGNOSIS — O09512 Supervision of elderly primigravida, second trimester: Secondary | ICD-10-CM

## 2014-11-27 DIAGNOSIS — O3412 Maternal care for benign tumor of corpus uteri, second trimester: Secondary | ICD-10-CM

## 2014-11-27 DIAGNOSIS — D259 Leiomyoma of uterus, unspecified: Secondary | ICD-10-CM

## 2014-11-27 LAB — CBC
HCT: 32.4 % — ABNORMAL LOW (ref 36.0–46.0)
HEMOGLOBIN: 11 g/dL — AB (ref 12.0–15.0)
MCH: 28.7 pg (ref 26.0–34.0)
MCHC: 34 g/dL (ref 30.0–36.0)
MCV: 84.6 fL (ref 78.0–100.0)
Platelets: 342 10*3/uL (ref 150–400)
RBC: 3.83 MIL/uL — ABNORMAL LOW (ref 3.87–5.11)
RDW: 14.2 % (ref 11.5–15.5)
WBC: 12.3 10*3/uL — AB (ref 4.0–10.5)

## 2014-11-27 LAB — TYPE AND SCREEN
ABO/RH(D): O POS
Antibody Screen: NEGATIVE

## 2014-11-27 LAB — ABO/RH: ABO/RH(D): O POS

## 2014-11-27 MED ORDER — DOCUSATE SODIUM 100 MG PO CAPS
100.0000 mg | ORAL_CAPSULE | Freq: Every day | ORAL | Status: DC
  Administered 2014-11-27 – 2014-11-29 (×2): 100 mg via ORAL
  Filled 2014-11-27 (×2): qty 1

## 2014-11-27 MED ORDER — CEFAZOLIN SODIUM-DEXTROSE 2-3 GM-% IV SOLR
2.0000 g | INTRAVENOUS | Status: AC
  Administered 2014-11-28: 2 g via INTRAVENOUS
  Filled 2014-11-27: qty 50

## 2014-11-27 MED ORDER — CALCIUM CARBONATE ANTACID 500 MG PO CHEW: 2.0000 | CHEWABLE_TABLET | ORAL | Status: DC | PRN

## 2014-11-27 MED ORDER — LACTATED RINGERS IV BOLUS (SEPSIS)
500.0000 mL | Freq: Once | INTRAVENOUS | Status: AC
  Administered 2014-11-27: 500 mL via INTRAVENOUS

## 2014-11-27 MED ORDER — CEFAZOLIN SODIUM 1-5 GM-% IV SOLN: 1.0000 g | Freq: Once | INTRAVENOUS | Status: DC

## 2014-11-27 MED ORDER — LACTATED RINGERS IV SOLN
INTRAVENOUS | Status: DC
  Administered 2014-11-28 – 2014-11-29 (×6): via INTRAVENOUS

## 2014-11-27 MED ORDER — TERBUTALINE SULFATE 1 MG/ML IJ SOLN
0.2500 mg | INTRAMUSCULAR | Status: AC
  Administered 2014-11-27: 0.25 mg via SUBCUTANEOUS

## 2014-11-27 MED ORDER — PRENATAL MULTIVITAMIN CH
1.0000 | ORAL_TABLET | Freq: Every day | ORAL | Status: DC
Start: 2014-11-27 — End: 2014-11-30
  Administered 2014-11-27 – 2014-11-29 (×2): 1 via ORAL
  Filled 2014-11-27 (×2): qty 1

## 2014-11-27 MED ORDER — ZOLPIDEM TARTRATE 5 MG PO TABS: 5.0000 mg | ORAL_TABLET | Freq: Every evening | ORAL | Status: DC | PRN

## 2014-11-27 MED ORDER — TERBUTALINE SULFATE 1 MG/ML IJ SOLN
INTRAMUSCULAR | Status: AC
  Filled 2014-11-27: qty 1

## 2014-11-27 MED ORDER — ACETAMINOPHEN 325 MG PO TABS: 650.0000 mg | ORAL_TABLET | ORAL | Status: DC | PRN

## 2014-11-27 MED ORDER — CEFAZOLIN SODIUM-DEXTROSE 2-3 GM-% IV SOLR
2.0000 g | INTRAVENOUS | Status: DC
  Filled 2014-11-27: qty 50

## 2014-11-27 NOTE — Progress Notes (Signed)
Patient ID: Renee Coleman, female   DOB: 1981-02-24, 34 y.o.   MRN: [id number] Sonogram with MFM no measurable cervix Speculum exam cervix closed no prolapsing membranes Recommend cerlcalge Was contracting has stopped with hydration terbutaline.  Risk of cerclage discussed

## 2014-11-27 NOTE — H&P (Signed)
Renee Coleman is a 34 y.o. female at 19.5 weeks.  Sonogram today with dynamic cervix with funneling to the external os.  Cervical length 4.1 mm. Does have multiple fibroids. History OB History    Gravida Para Term Preterm AB TAB SAB Ectopic Multiple Living   1              Past Medical History  Diagnosis Date  . Medical history non-contributory    Past Surgical History  Procedure Laterality Date  . No past surgeries     Family History: family history includes Cancer in her maternal grandmother; Miscarriages / Stillbirths in her maternal aunt; Stroke in her paternal grandmother. Social History:  reports that she has never smoked. She has never used smokeless tobacco. She reports that she drinks about 0.6 oz of alcohol per week. She reports that she does not use illicit drugs.   Prenatal Transfer Tool  Maternal Diabetes. na Genetic Screening: Normal Maternal Ultrasounds/Referrals: Abnormal:  Findings:   Other:cervical changes Fetal Ultrasounds or other Referrals:  None Maternal Substance Abuse:  No Significant Maternal Medications:  None Significant Maternal Lab Results:  None Other Comments:  None  Review of Systems  All other systems reviewed and are negative.     Last menstrual period 07/13/2014. Exam Physical Exam  Constitutional: She is oriented to person, place, and time.  Cardiovascular: Normal rate, regular rhythm and normal heart sounds.   Respiratory: Effort normal and breath sounds normal.  GI:  Gravid uterus c/w dates  Neurological: She is alert and oriented to person, place, and time. She has normal reflexes.    Prenatal labs: ABO, Rh:   Antibody:   Rubella:   RPR:    HBsAg:    HIV:    GBS:     Assessment/Plan: IUP at 19.5 weeks with thinning dynamic cervix Uterine fibroids Bed rest  MFM consult Consider cerclage   MCCOMB,JOHN S 11/27/2014, 12:19 PM

## 2014-11-28 ENCOUNTER — Encounter (HOSPITAL_COMMUNITY)
Admission: AD | Disposition: A | Discharge status: Home / Self Care | Payer: Self-pay | Source: Ambulatory Visit | Attending: Obstetrics and Gynecology

## 2014-11-28 ENCOUNTER — Observation Stay (HOSPITAL_COMMUNITY): Payer: BC Managed Care – PPO | Admitting: Certified Registered Nurse Anesthetist

## 2014-11-28 DIAGNOSIS — O3432 Maternal care for cervical incompetence, second trimester: Secondary | ICD-10-CM | POA: Diagnosis not present

## 2014-11-28 HISTORY — PX: CERVICAL CERCLAGE: SHX1329

## 2014-11-28 SURGERY — CERCLAGE, CERVIX, VAGINAL APPROACH
Anesthesia: Spinal | Site: Vagina

## 2014-11-28 MED ORDER — PHENYLEPHRINE HCL 10 MG/ML IJ SOLN
INTRAMUSCULAR | Status: DC | PRN
  Administered 2014-11-28: 80 ug via INTRAVENOUS

## 2014-11-28 MED ORDER — TERBUTALINE SULFATE 1 MG/ML IJ SOLN
0.2500 mg | Freq: Once | INTRAMUSCULAR | Status: AC
Start: 2014-11-28 — End: 2014-11-28
  Administered 2014-11-28: 0.25 mg via SUBCUTANEOUS

## 2014-11-28 MED ORDER — BUPIVACAINE IN DEXTROSE 0.75-8.25 % IT SOLN
INTRATHECAL | Status: DC | PRN
  Administered 2014-11-28: 1 mL via INTRATHECAL

## 2014-11-28 MED ORDER — MEPERIDINE HCL 25 MG/ML IJ SOLN: 6.2500 mg | INTRAMUSCULAR | Status: DC | PRN

## 2014-11-28 MED ORDER — TERBUTALINE SULFATE 1 MG/ML IJ SOLN
INTRAMUSCULAR | Status: AC
  Filled 2014-11-28: qty 1

## 2014-11-28 MED ORDER — CLINDAMYCIN PHOSPHATE 900 MG/50ML IV SOLN
900.0000 mg | Freq: Once | INTRAVENOUS | Status: AC
  Administered 2014-11-28: 900 mg via INTRAVENOUS
  Filled 2014-11-28: qty 50

## 2014-11-28 MED ORDER — TERBUTALINE SULFATE 1 MG/ML IJ SOLN: 0.2500 mg | Freq: Once | INTRAMUSCULAR | Status: AC

## 2014-11-28 MED ORDER — SODIUM CHLORIDE 0.9 % IV SOLN
1.0000 g | Freq: Four times a day (QID) | INTRAVENOUS | Status: DC
  Administered 2014-11-28 – 2014-11-30 (×7): 1 g via INTRAVENOUS
  Filled 2014-11-28 (×13): qty 1000

## 2014-11-28 MED ORDER — FENTANYL CITRATE (PF) 100 MCG/2ML IJ SOLN: 25.0000 ug | INTRAMUSCULAR | Status: DC | PRN

## 2014-11-28 SURGICAL SUPPLY — 19 items
CLOTH BEACON ORANGE TIMEOUT ST (SAFETY) ×3 IMPLANT
COUNTER NEEDLE 1200 MAGNETIC (NEEDLE) ×3 IMPLANT
GLOVE BIO SURGEON STRL SZ 6.5 (GLOVE) ×2 IMPLANT
GLOVE BIO SURGEONS STRL SZ 6.5 (GLOVE) ×1
GLOVE BIOGEL PI IND STRL 7.0 (GLOVE) ×1 IMPLANT
GLOVE BIOGEL PI INDICATOR 7.0 (GLOVE) ×2
GOWN STRL REUS W/TWL LRG LVL3 (GOWN DISPOSABLE) ×6 IMPLANT
NEEDLE MAYO .5 CIRCLE (NEEDLE) ×3 IMPLANT
PACK VAGINAL MINOR WOMEN LF (CUSTOM PROCEDURE TRAY) ×3 IMPLANT
PAD OB MATERNITY 4.3X12.25 (PERSONAL CARE ITEMS) ×3 IMPLANT
PAD PREP 24X48 CUFFED NSTRL (MISCELLANEOUS) ×3 IMPLANT
SUT ETHIBOND  5 (SUTURE) ×2
SUT ETHIBOND 5 (SUTURE) IMPLANT
TOWEL OR 17X24 6PK STRL BLUE (TOWEL DISPOSABLE) ×6 IMPLANT
TRAY FOLEY CATH SILVER 14FR (SET/KITS/TRAYS/PACK) ×3 IMPLANT
TUBING NON-CON 1/4 X 20 CONN (TUBING) ×1 IMPLANT
TUBING NON-CON 1/4 X 20' CONN (TUBING) ×1
WATER STERILE IRR 1000ML POUR (IV SOLUTION) ×3 IMPLANT
YANKAUER SUCT BULB TIP NO VENT (SUCTIONS) ×2 IMPLANT

## 2014-11-28 NOTE — Op Note (Signed)
11/27/2014 - 11/28/2014  2:39 PM  PATIENT:  Renee Coleman  34 y.o. female  PRE-OPERATIVE DIAGNOSIS:  cervical incompetence  POST-OPERATIVE DIAGNOSIS:  cervical incompetence  PROCEDURE:  Procedure(s): CERCLAGE CERVICAL (N/A)  SURGEON:  Surgeon(s) and Role:    * Marylynn Pearson, MD - Primary    * Arvella Nigh, MD - Assisting   ANESTHESIA:   spinal  EBL:  Total I/O In: 1000 [I.V.:1000] Out: 175 [Urine:150; Blood:25]  BLOOD ADMINISTERED:none  DRAINS: none   LOCAL MEDICATIONS USED:  NONE  SPECIMEN:  No Specimen  DISPOSITION OF SPECIMEN:  N/A  COUNTS:  YES  TOURNIQUET:  * No tourniquets in log *  DICTATION: .Other Dictation: Dictation Number pending  PLAN OF CARE: Admit to inpatient   PATIENT DISPOSITION:  PACU - hemodynamically stable.   Delay start of Pharmacological VTE agent (>24hrs) due to surgical blood loss or risk of bleeding: yes

## 2014-11-28 NOTE — Anesthesia Preprocedure Evaluation (Signed)
Anesthesia Evaluation  Patient identified by MRN, date of birth, ID band Patient awake    Reviewed: Allergy & Precautions, H&P , NPO status , Patient's Chart, lab work & pertinent test results  Airway Mallampati: I  TM Distance: >3 FB Neck ROM: full    Dental no notable dental hx.    Pulmonary neg pulmonary ROS,    Pulmonary exam normal        Cardiovascular negative cardio ROS Normal cardiovascular exam     Neuro/Psych negative neurological ROS  negative psych ROS   GI/Hepatic negative GI ROS, Neg liver ROS,   Endo/Other  negative endocrine ROS  Renal/GU negative Renal ROS     Musculoskeletal   Abdominal Normal abdominal exam  (+)   Peds  Hematology negative hematology ROS (+)   Anesthesia Other Findings   Reproductive/Obstetrics (+) Pregnancy                             Anesthesia Physical Anesthesia Plan  ASA: II  Anesthesia Plan: Spinal   Post-op Pain Management:    Induction:   Airway Management Planned:   Additional Equipment:   Intra-op Plan:   Post-operative Plan:   Informed Consent: I have reviewed the patients History and Physical, chart, labs and discussed the procedure including the risks, benefits and alternatives for the proposed anesthesia with the patient or authorized representative who has indicated his/her understanding and acceptance.     Plan Discussed with: CRNA and Surgeon  Anesthesia Plan Comments:         Anesthesia Quick Evaluation  

## 2014-11-28 NOTE — Transfer of Care (Signed)
Immediate Anesthesia Transfer of Care Note  Patient: Renee Coleman  Procedure(s) Performed: Procedure(s): CERCLAGE CERVICAL (N/A)  Patient Location: PACU  Anesthesia Type:Spinal  Level of Consciousness: awake, alert  and oriented  Airway & Oxygen Therapy: Patient Spontanous Breathing and Patient connected to nasal cannula oxygen  Post-op Assessment: Report given to RN and Post -op Vital signs reviewed and stable  Post vital signs: Reviewed and stable  Last Vitals:  Filed Vitals:   11/28/14 1143  BP: 122/77  Pulse: 81  Temp: 36.7 C  Resp: 18    Complications: No apparent anesthesia complications

## 2014-11-28 NOTE — Anesthesia Postprocedure Evaluation (Signed)
Anesthesia Post Note  Patient: Renee Coleman  Procedure(s) Performed: Procedure(s) (LRB): CERCLAGE CERVICAL (N/A)  Anesthesia type: Spinal  Patient location: PACU  Post pain: Pain level controlled  Post assessment: Post-op Vital signs reviewed  Last Vitals:  Filed Vitals:   11/28/14 1530  BP:   Pulse:   Temp:   Resp: 16    Post vital signs: Reviewed  Level of consciousness: awake  Complications: No apparent anesthesia complications

## 2014-11-28 NOTE — Progress Notes (Signed)
Pt resting comfortably  AF, VSS Gen - NAD Abd - NT Cvx - deferred  A/P:  Shortened cervix with funneling Recommendation is for rescue cerclage Discussed risks of procedure including risk of rupture membranes, labor and previable delivery Questions answered, informed consent obtained

## 2014-11-28 NOTE — Progress Notes (Signed)
Pt sitting up vomiting, feeling better afterwards.

## 2014-11-28 NOTE — Anesthesia Procedure Notes (Signed)
Spinal Patient location during procedure: OR Start time: 11/28/2014 2:09 PM End time: 11/28/2014 2:11 PM Staffing Anesthesiologist: Lyn Hollingshead Performed by: anesthesiologist  Preanesthetic Checklist Completed: patient identified, surgical consent, pre-op evaluation, timeout performed, IV checked, risks and benefits discussed and monitors and equipment checked Spinal Block Patient position: sitting Prep: site prepped and draped and DuraPrep Patient monitoring: heart rate, cardiac monitor, continuous pulse ox and blood pressure Approach: midline Location: L3-4 Injection technique: single-shot Needle Needle type: Pencan  Needle gauge: 24 G Needle length: 9 cm Needle insertion depth: 4 cm Assessment Sensory level: T10

## 2014-11-29 ENCOUNTER — Encounter (HOSPITAL_COMMUNITY): Payer: Self-pay | Admitting: Obstetrics and Gynecology

## 2014-11-29 DIAGNOSIS — O3432 Maternal care for cervical incompetence, second trimester: Secondary | ICD-10-CM | POA: Diagnosis not present

## 2014-11-29 MED ORDER — INDOMETHACIN 25 MG PO CAPS
25.0000 mg | ORAL_CAPSULE | Freq: Four times a day (QID) | ORAL | Status: DC
  Administered 2014-11-29 – 2014-11-30 (×4): 25 mg via ORAL
  Filled 2014-11-29 (×9): qty 1

## 2014-11-29 MED ORDER — INDOMETHACIN 50 MG PO CAPS
50.0000 mg | ORAL_CAPSULE | Freq: Once | ORAL | Status: AC
  Administered 2014-11-29: 50 mg via ORAL
  Filled 2014-11-29: qty 1

## 2014-11-29 MED ORDER — INDOMETHACIN 25 MG PO CAPS
25.0000 mg | ORAL_CAPSULE | Freq: Three times a day (TID) | ORAL | Status: DC
  Filled 2014-11-29 (×2): qty 1

## 2014-11-29 NOTE — Progress Notes (Signed)
S:  Patient is doing well.  Eating breakfast.  Contractions noted this am. Dr. Julien Girt ordered Indocin.  Antibiotics infusing now.  O:  BP 111/81 mmHg  Pulse 96  Temp(Src) 97.8 F (36.6 C) (Oral)  Resp 18  Ht 5' (1.524 m)  Wt 55.792 kg (123 lb)  BMI 24.02 kg/m2  SpO2 100%  LMP 07/13/2014 Abdomen is soft and non tender  No results found for this or any previous visit (from the past 24 hour(s)).  . ampicillin (OMNIPEN) IV  1 g Intravenous 4 times per day  . docusate sodium  100 mg Oral Daily  . indomethacin  25 mg Oral Q6H  . prenatal multivitamin  1 tablet Oral Q1200   Impression: IUP at 20 weeks Cervical incompetence Status post emergency cerclage on September 21  PLAN: Continue bedrest Given contractions will keep patient inpatient today Continue Indocin and ampicillin Received azithromax

## 2014-11-29 NOTE — Op Note (Signed)
NAMEMarland Kitchen  [name], [name] NO.:  [id number]  MEDICAL RECORD NO.:  [id number]  LOCATION:  5374                          FACILITY:  [facility]  PHYSICIAN:  Marylynn Pearson, MD    DATE OF BIRTH:  09/12/1980  DATE OF PROCEDURE:  11/28/2014 DATE OF DISCHARGE:                              OPERATIVE REPORT   PREOPERATIVE DIAGNOSIS:  Cervical incompetence.  POSTOPERATIVE DIAGNOSIS:  Cervical incompetence.  PROCEDURE:  Cervical cerclage.  SURGEON:  Marylynn Pearson, MD.  ASSISTANT:  Darlyn Chamber, M.D.  BLOOD LOSS:  Minimal.  ANESTHESIA:  Spinal.  PROCEDURE IN DETAIL:  The patient was taken to the operating room after informed consent was obtained.  She was given spinal anesthesia. Prepped and draped in sterile fashion and a Foley catheter was inserted. A weighted speculum was placed in the posterior vagina, and a Deaver was placed anteriorly.  She was noted to have cervix dilated approximately 1 cm with bulging exposed membranes, clear fluid noted within membranes. Cervix was grasped, and a single McDonald cerclage was performed with #5 Ethibond.  The suture was tied down and cut.  The cervix was then palpated to be closed.  She tolerated the procedure well.  All instruments were then removed.  She was taken to the recovery room in stable condition.     Marylynn Pearson, MD     GA/MEDQ  D:  11/28/2014  T:  11/28/2014  Job:  [id number]

## 2014-11-30 DIAGNOSIS — O3432 Maternal care for cervical incompetence, second trimester: Secondary | ICD-10-CM | POA: Diagnosis not present

## 2014-11-30 MED ORDER — AMPICILLIN 250 MG PO CAPS: 250.0000 mg | ORAL_CAPSULE | Freq: Four times a day (QID) | ORAL | Status: DC

## 2014-11-30 NOTE — Discharge Summary (Signed)
Physician Discharge Summary  Patient ID: Jamonica Schoff MRN: [id number] DOB/AGE: 1980-07-31 34 y.o.  Admit date: 11/27/2014 Discharge date: 11/30/2014  Admission Diagnoses:incompetent cervix  Discharge Diagnoses:  Active Problems:   Incompetent cervix during second trimester, antepartum   Discharged Condition: good  Hospital Course: take to OR for emergent Mcdonald cervical cerclage. Postoperatively did well, ambulating, tolerating regular diet.  Consults: None  Significant Diagnostic Studies: labs:  Results for orders placed or performed during the hospital encounter of 11/27/14 (from the past 72 hour(s))  CBC on admission     Status: Abnormal   Collection Time: 11/27/14  1:14 PM  Result Value Ref Range   WBC 12.3 (H) 4.0 - 10.5 K/uL   RBC 3.83 (L) 3.87 - 5.11 MIL/uL   Hemoglobin 11.0 (L) 12.0 - 15.0 g/dL   HCT 32.4 (L) 36.0 - 46.0 %   MCV 84.6 78.0 - 100.0 fL   MCH 28.7 26.0 - 34.0 pg   MCHC 34.0 30.0 - 36.0 g/dL   RDW 14.2 11.5 - 15.5 %   Platelets 342 150 - 400 K/uL  Type and screen     Status: None   Collection Time: 11/27/14  1:14 PM  Result Value Ref Range   ABO/RH(D) O POS    Antibody Screen NEG    Sample Expiration 11/30/2014   ABO/Rh     Status: None   Collection Time: 11/27/14  1:14 PM  Result Value Ref Range   ABO/RH(D) O POS     Treatments: surgery: cervical cerclage  Discharge Exam: Blood pressure 106/67, pulse 76, temperature 98 F (36.7 C), temperature source Oral, resp. rate 18, height 5' (1.524 m), weight 123 lb (55.792 kg), last menstrual period 07/13/2014, SpO2 100 %. General appearance: alert, cooperative and no distress GI: soft, non-tender; bowel sounds normal; no masses,  no organomegaly  Disposition: 01-Home or Self Care     Medication List    STOP taking these medications        acetaminophen 325 MG tablet  Commonly known as:  TYLENOL      TAKE these medications        ampicillin 250 MG capsule  Commonly known as:  PRINCIPEN   Take 1 capsule (250 mg total) by mouth 4 (four) times daily.     prenatal multivitamin Tabs tablet  Take 1 tablet by mouth daily at 12 noon.         Signed: Hollie Bartus II,Eddy Liszewski E 11/30/2014, 8:28 AM

## 2014-11-30 NOTE — Discharge Instructions (Signed)
Bedrest with bathroom privileges Pelvic rest

## 2014-11-30 NOTE — Progress Notes (Signed)
20 1/7 weeks Up to BR, IV saline locked, no UCs Scant occasional brown vaginal discharge, no BRB and no pelvic pressure  VSS Afeb Uterus soft, NT FHT +  A/P: D/C home         BR with BRP reviewed, PR         FU Monday in office

## 2014-12-11 ENCOUNTER — Ambulatory Visit (HOSPITAL_COMMUNITY): Payer: BC Managed Care – PPO

## 2014-12-11 ENCOUNTER — Inpatient Hospital Stay (HOSPITAL_COMMUNITY)
Admission: AD | Admit: 2014-12-11 | Discharge: 2014-12-20 | DRG: 775 | Disposition: A | Discharge status: Home / Self Care | Payer: BC Managed Care – PPO | Source: Ambulatory Visit | Attending: Obstetrics & Gynecology | Admitting: Obstetrics & Gynecology

## 2014-12-11 ENCOUNTER — Encounter (HOSPITAL_COMMUNITY): Payer: Self-pay

## 2014-12-11 ENCOUNTER — Inpatient Hospital Stay (HOSPITAL_COMMUNITY): Payer: BC Managed Care – PPO

## 2014-12-11 DIAGNOSIS — IMO0001 Reserved for inherently not codable concepts without codable children: Secondary | ICD-10-CM

## 2014-12-11 DIAGNOSIS — O3432 Maternal care for cervical incompetence, second trimester: Secondary | ICD-10-CM | POA: Diagnosis present

## 2014-12-11 DIAGNOSIS — Z809 Family history of malignant neoplasm, unspecified: Secondary | ICD-10-CM

## 2014-12-11 DIAGNOSIS — Z823 Family history of stroke: Secondary | ICD-10-CM | POA: Diagnosis not present

## 2014-12-11 DIAGNOSIS — O42912 Preterm premature rupture of membranes, unspecified as to length of time between rupture and onset of labor, second trimester: Principal | ICD-10-CM | POA: Diagnosis not present

## 2014-12-11 DIAGNOSIS — Z3A22 22 weeks gestation of pregnancy: Secondary | ICD-10-CM

## 2014-12-11 DIAGNOSIS — O42919 Preterm premature rupture of membranes, unspecified as to length of time between rupture and onset of labor, unspecified trimester: Secondary | ICD-10-CM | POA: Diagnosis present

## 2014-12-11 LAB — TYPE AND SCREEN
ABO/RH(D): O POS
ANTIBODY SCREEN: NEGATIVE

## 2014-12-11 LAB — POCT FERN TEST
POCT FERN TEST: POSITIVE
POCT Fern Test: POSITIVE

## 2014-12-11 LAB — CBC
HCT: 30.5 % — ABNORMAL LOW (ref 36.0–46.0)
HEMOGLOBIN: 10.2 g/dL — AB (ref 12.0–15.0)
MCH: 28.4 pg (ref 26.0–34.0)
MCHC: 33.4 g/dL (ref 30.0–36.0)
MCV: 85 fL (ref 78.0–100.0)
Platelets: 270 10*3/uL (ref 150–400)
RBC: 3.59 MIL/uL — AB (ref 3.87–5.11)
RDW: 14.4 % (ref 11.5–15.5)
WBC: 17.5 10*3/uL — AB (ref 4.0–10.5)

## 2014-12-11 MED ORDER — INDOMETHACIN 50 MG PO CAPS
50.0000 mg | ORAL_CAPSULE | Freq: Once | ORAL | Status: AC
  Administered 2014-12-11: 50 mg via ORAL
  Filled 2014-12-11: qty 1

## 2014-12-11 MED ORDER — DOCUSATE SODIUM 100 MG PO CAPS
100.0000 mg | ORAL_CAPSULE | Freq: Every day | ORAL | Status: DC
  Administered 2014-12-11: 100 mg via ORAL
  Filled 2014-12-11: qty 1

## 2014-12-11 MED ORDER — AZITHROMYCIN 250 MG PO TABS
500.0000 mg | ORAL_TABLET | Freq: Every day | ORAL | Status: DC
  Administered 2014-12-11 – 2014-12-14 (×4): 500 mg via ORAL
  Filled 2014-12-11 (×4): qty 2

## 2014-12-11 MED ORDER — ZOLPIDEM TARTRATE 5 MG PO TABS: 5.0000 mg | ORAL_TABLET | Freq: Every evening | ORAL | Status: DC | PRN

## 2014-12-11 MED ORDER — SODIUM CHLORIDE 0.9 % IV SOLN
3.0000 g | Freq: Four times a day (QID) | INTRAVENOUS | Status: DC
  Administered 2014-12-11 – 2014-12-15 (×18): 3 g via INTRAVENOUS
  Filled 2014-12-11 (×20): qty 3

## 2014-12-11 MED ORDER — AMOXICILLIN 500 MG PO CAPS: 500.0000 mg | ORAL_CAPSULE | Freq: Three times a day (TID) | ORAL | Status: DC

## 2014-12-11 MED ORDER — DOCUSATE SODIUM 100 MG PO CAPS
100.0000 mg | ORAL_CAPSULE | Freq: Every day | ORAL | Status: DC
  Administered 2014-12-11 – 2014-12-13 (×3): 100 mg via ORAL
  Filled 2014-12-11 (×6): qty 1

## 2014-12-11 MED ORDER — PRENATAL MULTIVITAMIN CH
1.0000 | ORAL_TABLET | Freq: Every day | ORAL | Status: DC
  Administered 2014-12-11 – 2014-12-14 (×4): 1 via ORAL
  Filled 2014-12-11 (×6): qty 1

## 2014-12-11 MED ORDER — MAGNESIUM SULFATE BOLUS VIA INFUSION
4.0000 g | Freq: Once | INTRAVENOUS | Status: AC
Start: 2014-12-11 — End: 2014-12-11
  Administered 2014-12-11: 4 g via INTRAVENOUS
  Filled 2014-12-11: qty 500

## 2014-12-11 MED ORDER — CALCIUM CARBONATE ANTACID 500 MG PO CHEW
2.0000 | CHEWABLE_TABLET | ORAL | Status: DC | PRN
  Filled 2014-12-11: qty 2

## 2014-12-11 MED ORDER — INDOMETHACIN 25 MG PO CAPS
25.0000 mg | ORAL_CAPSULE | Freq: Four times a day (QID) | ORAL | Status: AC
  Administered 2014-12-11 – 2014-12-14 (×11): 25 mg via ORAL
  Filled 2014-12-11 (×12): qty 1

## 2014-12-11 MED ORDER — LACTATED RINGERS IV BOLUS (SEPSIS)
1000.0000 mL | Freq: Once | INTRAVENOUS | Status: AC
  Administered 2014-12-11: 1000 mL via INTRAVENOUS

## 2014-12-11 MED ORDER — LACTATED RINGERS IV SOLN
INTRAVENOUS | Status: DC
Start: 2014-12-11 — End: 2014-12-14
  Administered 2014-12-11: 125 mL/h via INTRAVENOUS
  Administered 2014-12-11 – 2014-12-14 (×7): via INTRAVENOUS

## 2014-12-11 MED ORDER — ACETAMINOPHEN 325 MG PO TABS: 650.0000 mg | ORAL_TABLET | ORAL | Status: DC | PRN

## 2014-12-11 MED ORDER — PRENATAL MULTIVITAMIN CH
1.0000 | ORAL_TABLET | Freq: Every day | ORAL | Status: DC
  Filled 2014-12-11: qty 1

## 2014-12-11 MED ORDER — ONDANSETRON HCL 4 MG PO TABS: 4.0000 mg | ORAL_TABLET | Freq: Three times a day (TID) | ORAL | Status: DC | PRN

## 2014-12-11 MED ORDER — DEXTROSE IN LACTATED RINGERS 5 % IV SOLN
INTRAVENOUS | Status: DC
Start: 2014-12-11 — End: 2014-12-14

## 2014-12-11 MED ORDER — MAGNESIUM SULFATE 50 % IJ SOLN
2.0000 g/h | INTRAMUSCULAR | Status: DC
  Administered 2014-12-11 – 2014-12-12 (×4): 2 g/h via INTRAVENOUS
  Filled 2014-12-11 (×3): qty 80

## 2014-12-11 MED ORDER — SODIUM CHLORIDE 0.9 % IV SOLN
2.0000 g | Freq: Four times a day (QID) | INTRAVENOUS | Status: DC
  Administered 2014-12-11: 2 g via INTRAVENOUS
  Filled 2014-12-11 (×2): qty 2000

## 2014-12-11 MED ORDER — PROMETHAZINE HCL 25 MG/ML IJ SOLN: 12.5000 mg | Freq: Four times a day (QID) | INTRAMUSCULAR | Status: DC | PRN

## 2014-12-11 NOTE — MAU Note (Signed)
Ultrasound tech states she has spoken with perinatologist and needs to do a vaginal probe ultrasound, call to [place] to update and get okay to use vaginal probe. No answer so call to [place], CNM and verified to proceed with vaginal probe ultrasound.

## 2014-12-11 NOTE — MAU Provider Note (Signed)
  History     CSN: [id number]  Arrival date and time: 12/11/14 1696   First Provider Initiated Contact with Patient 12/11/14 0457      No chief complaint on file.   HPI Renee Coleman is a 34 y.o. G1P0 at [redacted]w[redacted]d who presents today with leaking of fluid. She states that around 0415 she had a large gush of clear fluid. She states that since then she has had some abdominal pain. She states that she had a cerclage placed 3 weeks ago after it was noted that she had a shortened cervix on [place].   Past Medical History  Diagnosis Date  . Medical history non-contributory     Past Surgical History  Procedure Laterality Date  . No past surgeries    . Cervical cerclage N/A 11/28/2014    Procedure: CERCLAGE CERVICAL;  Surgeon: Marylynn Pearson, MD;  Location: [facility];  Service: Gynecology;  Laterality: N/A;    Family History  Problem Relation Age of Onset  . Miscarriages / Stillbirths Maternal Aunt   . Cancer Maternal Grandmother   . Stroke Paternal Grandmother     Social History  Substance Use Topics  . Smoking status: Never Smoker   . Smokeless tobacco: Never Used  . Alcohol Use: 0.6 oz/week    1 Glasses of wine per week     Comment: 1-2 drinks per day before preg    Allergies: No Known Allergies  Prescriptions prior to admission  Medication Sig Dispense Refill Last Dose  . Prenatal Vit-Fe Fumarate-FA (PRENATAL MULTIVITAMIN) TABS tablet Take 1 tablet by mouth daily at 12 noon.   12/10/2014 at Unknown time  . ampicillin (PRINCIPEN) 250 MG capsule Take 1 capsule (250 mg total) by mouth 4 (four) times daily. 16 capsule 0     ROS Physical Exam   Blood pressure 125/80, pulse 92, temperature 97.7 F (36.5 C), temperature source Oral, resp. rate 18, last menstrual period 07/13/2014, SpO2 100 %.  Physical Exam  Nursing note and vitals reviewed. Constitutional: She is oriented to person, place, and time. She appears well-developed and well-nourished. No distress.  HENT:  Head:  Normocephalic.  Cardiovascular: Normal rate.   Respiratory: Effort normal.  GI: Soft. There is no tenderness. There is no rebound.  Genitourinary:  Large pooling of clear/pink tinged fluid between legs   Neurological: She is alert and oriented to person, place, and time.  Skin: Skin is warm and dry.  Psychiatric: She has a normal mood and affect.   Results for orders placed or performed during the hospital encounter of 12/11/14 (from the past 24 hour(s))  Fern Test     Status: None   Collection Time: 12/11/14  4:54 AM  Result Value Ref Range   POCT Fern Test Positive = ruptured amniotic membanes   Fern Test     Status: Normal   Collection Time: 12/11/14  5:00 AM  Result Value Ref Range   POCT Fern Test Positive = ruptured amniotic membanes     MAU Course  Procedures  MDM 7893: D/W Dr. Matthew Saras. Will start IV, and get complete US. Then routine admission orders. He will manage from there.   Assessment and Plan  PPROM Admit to antenatal     Mathis Bud 12/11/2014, 5:00 AM

## 2014-12-11 NOTE — Progress Notes (Signed)
MFM consultation:  I have evaluated and reviewed this patient in consultation as follows:  Renee Coleman  has been diagnosed and admitted for preterm premature rupture of membranes at [redacted]w[redacted]d as established by her obstetrical provider.  I personally examined her with the following representing my findings: Filed Vitals:   12/11/14 1200  BP: 98/65  Pulse: 89  Temp:   Resp:     Afebrile, female in no acute distress, comfortable and not in pain. No fundal or abdominal tenderness By sterile speculum examination, there was a minimal amount of old blood/clot in the vaginal vault.  Cervical os is about 5-86mm dilated visually with trailing membranes.  No active bleeding.  Cerclage is seen, intact, not pulled through and no tension appreciable.    1. Cerclage in-situ in setting of pPROM, intermittent contractions, no pain, and no active bleeding from cerclage site:  This is a very difficult situation for any provider and patient at this periviable gestational age.  I explained to her that I would remove the cerclage if she desired after our discussion.  She understands that the cerclage is not under any tension, she is not in active labor currently, and she is not actively abrupting or bleeding at all from the cerclage site.  She understands that the cerclage is theoretically providing support and aid of her cervix in it's ability to continue to maintain the pregnancy in utero in absence of infection/labor/evolving abruption.  She also understands risk that she could rapidly progress in labor or have an acute evolution of a placental abruption.  There is no sonographic evidence of abruption.  If she rapidly progresses into labor or begins to definitively labor with evidence of tension on the cerclage, bleeding from the cervix/cerclage site or painfully contract, I recommend the cerclage be removed immediately.  She desires to keep the cerclage in situ until labor/infection/active vaginal bleeding and  understands the risk including hemorrhage, injury to cervix, and risk for even hysterectomy while this would be rare.  She understands that cerclage removal will be more difficult if under tension.  She also understands that removal now may lead to bleeding, infection, injury of cervix and pregnancy loss.  I spent considerable time at bedside weighing the options and she knows that she may ask for removal at any time.  That being said, she declines cerclage removal at this time.  2. Extreme prematurity and steroids for fetal morbidity/mortality (BMZ): Current ACOG committee opinion (Sciscione et al 2015) recommends antenatal corticosteroids for the 23 week neonate to improve survival and reduce morbidity. When this patient achieves 22 weeks and 5 days, it is reasonable to administer steroids so that when this fetus is 23 weeks her baby will have the benefit of steroids. This line of thinking (beginning steroids at [redacted]w[redacted]d in preparation for possible 23 wk delivery) is also consistent with Laural Roes (internationally regarded in MFM) expert opinion published in Up-to-Date.  That being said ACOG also states now that viability begins as early as [redacted] weeks gestational age for purposes of neonatal resuscitation.  Therefore, if the patient wants resuscitation of her fetus with spontaneous delivery after 22 weeks then I would not withhold or prevent her from getting steroids once she is [redacted] weeks gestation, noting that this is a controversial area for both neonatalogy and obstetrics/perinatology.  The patient reports that after she speaks with the NICU, she will let Dr. Corinna Capra know (her OB doctor) when she wants to get steroids whether that be at [redacted]w[redacted]d, [redacted]w[redacted]d, [redacted]w[redacted]d,  [redacted]w[redacted]d or at [redacted]w[redacted]d.  The patient must decide when to get steroids in my opinion upon achievement of what ACOG deems a 'viable gestational age60 (>22 weeks) for this 417gm fetus at [redacted]w[redacted]d.  I would consider repeating course if she should be 2 weeks out from  the initial course and remain as high risk for preterm birth as she is today.  3. Preterm premature rupture of membranes:  First, I agree with latency antibiotic course for 7 days as already initiated.  Second,  I explained to her that at this point our primary concern was for ascending intrauterine/intraamniotic infection, which poses risk to her as well as her fetus. She understood that inpatient management was essential with serial examinations and fetal heart rate tracings beginning at viability (22 0/7 weeks) to screen for onset of infection and that evidence of such would prompt delivery.  That being said, I would not advocate for emergent delivery by cesarean for fetal indication prior to steroid administration or achievement of 23 weeks albeit this choice is not mine.  That choice belongs to the patient so if she should elect for emergent intervention for fetal behalf prior to 23 weeks following her discussion with pediatrics this is her choice after 22 weeks.  She seemed to indicate that she would not desire emergent cesarean prior to 23 weeks for fetal reasons but would want full resuscitation to be offered in setting of spontaneous delivery after 22 weeks.  Rates of spontaneous delivery within 7 days of rupture is 50% and 75% within 28 days of rupture. I explained to her timing of delivery in absence of infection and in presence of reassuring fetal status has been debated historically and recently by many experts. Regardless, I cited to her that the best available and most well-accepted timing of delivery in context of otherwise reassuring maternal-fetus status affected by pPROM is at [redacted] weeks gestational age. This most fully balances the risk of prematurity against that of an occult intrauterine infection. Occult intrauterine infection is the second leading cause of cerebral palsy as opposed to the leading cause which is, of course, prematurity. She seemed to grasp the concept as well as our  specialty's rationale.  4. Neonatal outcomes: While I briefly discussed risks for pulmonary hypoplasia, RDS, NEC, IVH, sensory/cognitive deficits longterm with extreme prematurity and pPROM at periviable gestational age as well as fetal/neonatal demise, I explained to her that a detailed discussion with NICU would be appropriate today.  NICU consult is ordered by Dr. Corinna Capra.  5. Until viability, mode of delivery should be vaginally regardless of presentation. I will defer definition of viability to follow the NICU discussion. The patient may then discuss when she would consider intervention on fetal behalf but this should not be prior to 22 weeks and I would exhibit caution with emergent cesarean prior to 23 weeks and completion of steroid course given the risks of classical cesarean section to the mother and poor neonatal intact survival rates prior to 23 weeks. 6. Tocolysis in context of pPROM, no clear evidence of abruption:   I agree with MgSO4 for both tocolysis and CP prophylaxis.  I would continue this for 48-72 hours and consider adding indocin x 48-72 hours.  The patient understands risks/benefits.  If the patient has onset of bright red vaginal bleeding/clear abruption picture then I would discontinue tocolysis attempts and remove the cerclage.  All of your patient's questions were addressed to her satisfaction today. Although this was a lot of information to take  in, she and her husband demonstrated excellent comprehension of my impression, recommendations, limitations of medical care in the periviable state, and the underlying rationale. Impression:  1. SIUP at [redacted]w[redacted]d, cervical insufficency s/p cerclage noting intact and no tension  2. Preterm premature rupture of membranes  Summary of Recommendations:  1. initiation of BMZ x 2 sometime after [redacted]w[redacted]d and when patient desires this (awaiting NICU consult to make final patient decision) 2. Magnesium Sulfate prophylaxis/tocolysis x 48-72 hours   3  Latency antibiotics 7 day course: 48 hr IV and 5 days of PO. 4. SCD's or TED hose for DVT prophylaxis; 5. Indocin 50mg  load and 25 mg po q6hour x 48-72 hours (defer final time to Dr. Corinna Capra) 6. NICU consultation  7. Would perform daily EFM beginning at 22 weeks noting I do not advocate for emergency cesarean prior to 23 weeks for fetal distress but patient may opt for this should she so decide after discussion with pediatrics 8. Removal of cerclage if infection/abruption/labor/tension on stitch become apparent clinically 9. Timing of delivery should be anticipated by 34 weeks provided that testing remains reassuring and there is no evidence of chorioamnionitis; 10. I would deliver prior to 34 weeks for nonreassuring fetal testing, evidence of chorioamnionitis, or other evidence of maternal or fetal deterioration; 11. Interval growth should be continually reassessed every 2-3 weeks to provide ongoing dialogue with NICU for survival rates/prognosis. 12 weekly ultrasound to screen for abruption and assess cervix transabdominally to best of our ability 13. The patient should be monitored by serial clinical exams, serial vital signs, daily EFM's beginning at 22 weeks/NST's (more often if clinically warranted), CBC only as clinically indicated (noting that WBC at or above 20,000 constitutes leukocytosis for pregnancy).  Please do not hesitate to contact me for further questions/input.  Time Spent:  I spent in excess of 60 minutes in consultation with this patient to review records, evaluate her case, and provide her with an adequate discussion and education.  More than 50% of this time was spent in direct face-to-face counseling.  It was a pleasure seeing your patient in the office today.  Thank you for consultation. Please do not hesitate to contact our service for any further questions.   Thank you,  Delman Cheadle Harl Favor, Delman Cheadle, MD, MS, FACOG Assistant Professor Section of  [place]

## 2014-12-11 NOTE — H&P (Signed)
Renee Coleman is a 34 y.o. female presenting for PPROM.  Admitted last night by Dr Matthew Saras after she presented to MAU after midnight with leaking of clear fluid.  Denies any ctxs but did notice some light vaginal bleeding.  Was hospitalized 10 days ago with incompetent cx and received. History OB History    Gravida Para Term Preterm AB TAB SAB Ectopic Multiple Living   1              Past Medical History  Diagnosis Date  . Medical history non-contributory    Past Surgical History  Procedure Laterality Date  . No past surgeries    . Cervical cerclage N/A 11/28/2014    Procedure: CERCLAGE CERVICAL;  Surgeon: Marylynn Pearson, MD;  Location: [facility];  Service: Gynecology;  Laterality: N/A;   Family History: family history includes Cancer in her maternal grandmother; Miscarriages / Stillbirths in her maternal aunt; Stroke in her paternal grandmother. Social History:  reports that she has never smoked. She has never used smokeless tobacco. She reports that she drinks about 0.6 oz of alcohol per week. She reports that she does not use illicit drugs.   Prenatal Transfer Tool  Maternal Diabetes: No Genetic Screening: Normal Maternal Ultrasounds/Referrals: Normal Fetal Ultrasounds or other Referrals:  None Maternal Substance Abuse:  No Significant Maternal Medications:  None Significant Maternal Lab Results:  None Other Comments:  None  ROS    Blood pressure 111/80, pulse 117, temperature 98.5 F (36.9 C), temperature source Oral, resp. rate 18, height 5' (1.524 m), weight 123 lb (55.792 kg), last menstrual period 07/13/2014, SpO2 100 %. Exam Physical Exam  Prenatal labs: ABO, Rh: --/--/O POS (10/04 6606) Antibody: NEG (10/04 0514) Rubella:   RPR:    HBsAg:    HIV:    GBS:    Prelim US shows transverse presentation and c/w PROM Ferm positive  Assessment/Plan: IUP at 21 5/7 with PPROM and incompetent Cx Discussed situation with patient and her family.  Since she is starting to  ctx, will begin 24-48hours of [place]. Latency abx and bedrest.  Dr Matthew Saras has consulted MFM for opinion.  Will leave cerclage in at this time unless labor or sxs of infection.  Pt wants to do everything possible at this point.  Briefly discussed BMZ but will wait until 23-24 weeks unless MFM thinks otherwise.   Shloima Clinch C 12/11/2014, 10:19 AM

## 2014-12-11 NOTE — MAU Note (Signed)
Pt states SROM at 0420-clear fluid. No bleeding. Denies pain. Cerclage placed on 11/27/2014.

## 2014-12-11 NOTE — Progress Notes (Signed)
Pt denies abdominal tenderness, pain, denies ctx, states she will alert RN for any changes in status. Minimal bright red blood noted on perineum. FHR 133-136, pt tolerating regular diet, multiple visitors at bedside, very supportive.

## 2014-12-12 LAB — CULTURE, BETA STREP (GROUP B ONLY)

## 2014-12-12 NOTE — Progress Notes (Signed)
Pt reports 2-3 ctx/day.  No vb.  No pain.  + FM.    AF, VSS gen - NAD Abd - NT Ext - NT, no edema  A/P:  Incompetent cervix/PPROM/cerclage S/p MFM consult:  Summary of Recommendations:  1. initiation of BMZ x 2 sometime after [redacted]w[redacted]d and when patient desires this (awaiting NICU consult to make final patient decision) 2. Magnesium Sulfate prophylaxis/tocolysis x 48-72 hours  3 Latency antibiotics 7 day course: 48 hr IV and 5 days of PO. 4. SCD's or TED hose for DVT prophylaxis; 5. Indocin 50mg  load and 25 mg po q6hour x 48-72 hours (defer final time to Dr. Corinna Capra) 6. NICU consultation  7. Would perform daily EFM beginning at 22 weeks noting I do not advocate for emergency cesarean prior to 23 weeks for fetal distress but patient may opt for this should she so decide after discussion with pediatrics 8. Removal of cerclage if infection/abruption/labor/tension on stitch become apparent clinically 9. Timing of delivery should be anticipated by 34 weeks provided that testing remains reassuring and there is no evidence of chorioamnionitis; 10. I would deliver prior to 34 weeks for nonreassuring fetal testing, evidence of chorioamnionitis, or other evidence of maternal or fetal deterioration; 11. Interval growth should be continually reassessed every 2-3 weeks to provide ongoing dialogue with NICU for survival rates/prognosis. 12 weekly ultrasound to screen for abruption and assess cervix transabdominally to best of our ability 13. The patient should be monitored by serial clinical exams, serial vital signs, daily EFM's beginning at 22 weeks/NST's (more often if clinically warranted), CBC only as clinically indicated (noting that WBC at or above 20,000 constitutes leukocytosis for pregnancy).

## 2014-12-12 NOTE — Consult Note (Signed)
Neonatology Consult Note:  At the request of the patients obstetrician Dr. Corinna Capra I met with Renee Coleman and her husband.  She is currently 21 6 weeks with pregnancy complicated by preterm premature rupture of membranes at [redacted]w[redacted]d, intermittent contractions and incompetent cervix.  S/p cerclage placement 9/21.  She is receiving magnesium sulfate, latency antibiotics and bedrest.   We initially discussed morbidity/mortality at 267weeks gestional age including national mortality of 75% and significant risks of long term developmental deficits.  We discussed various complications of prematurity such as risk for chronic lung disease, risk for IVH with potential for motor / cognitive deficits, ROP, NEC.  The timing at which to offer neonatal resuscitation in the setting of periviable gestation is controversial.  After discussion of outcomes at 23 weeks as a point of comparison we discussed outcomes at 22 weeks.  Many of these studies are complicated by the fact that resuscitation was not offered to many neonates included in the studies however survival is often in the single digits in these studies.  Based on this discussion the parents requested steroids at 22 5 weeks providing this is appropriate medically per her obstetrician (Dr. LCorinna Capra.  Thank you for allowing [facility] participate in her care.  Please call with questions.  BHiginio Roger DO  Neonatologist  The total length of face-to-face or floor / unit time for this encounter was 25 minutes.  Counseling and / or coordination of care was greater than fifty percent of the time.

## 2014-12-13 NOTE — Progress Notes (Signed)
22 0/7 weeks  Scant leaking today, no bleeding, no UCs  VSS Afeb Uterus soft, NT  UCs none  MFM and Neonatology consults done  A/P: Incompetent Cx-Cerclage in place         PPROM         Transverse Lie         D/W patient and husband-after consideration of above consultations they request C/S for distress @ 24 weeks, BMTZ @ 22 5/7 weeks, removal of stitch if signs of chorioamnionitis or labor or abruption         D/C magnesium sulfate         D/C indocin @ 72 hours

## 2014-12-14 LAB — TYPE AND SCREEN
ABO/RH(D): O POS
ANTIBODY SCREEN: NEGATIVE

## 2014-12-14 MED ORDER — PRENATAL MULTIVITAMIN CH
1.0000 | ORAL_TABLET | Freq: Every day | ORAL | Status: DC
  Administered 2014-12-15 – 2014-12-17 (×3): 1 via ORAL
  Filled 2014-12-14 (×3): qty 1

## 2014-12-14 MED ORDER — LACTATED RINGERS IV SOLN
INTRAVENOUS | Status: DC
Start: 2014-12-14 — End: 2014-12-18
  Administered 2014-12-14 – 2014-12-17 (×3): via INTRAVENOUS

## 2014-12-14 MED ORDER — DEXTROSE IN LACTATED RINGERS 5 % IV SOLN: INTRAVENOUS | Status: DC

## 2014-12-14 MED ORDER — AZITHROMYCIN 250 MG PO TABS
500.0000 mg | ORAL_TABLET | Freq: Every day | ORAL | Status: AC
  Administered 2014-12-15 – 2014-12-17 (×3): 500 mg via ORAL
  Filled 2014-12-14 (×3): qty 2

## 2014-12-14 MED ORDER — CALCIUM CARBONATE ANTACID 500 MG PO CHEW
2.0000 | CHEWABLE_TABLET | ORAL | Status: DC | PRN
  Administered 2014-12-16: 400 mg via ORAL
  Filled 2014-12-14: qty 2

## 2014-12-14 MED ORDER — ACETAMINOPHEN 325 MG PO TABS: 650.0000 mg | ORAL_TABLET | ORAL | Status: DC | PRN

## 2014-12-14 MED ORDER — DOCUSATE SODIUM 100 MG PO CAPS
100.0000 mg | ORAL_CAPSULE | Freq: Three times a day (TID) | ORAL | Status: DC
  Administered 2014-12-14: 100 mg via ORAL
  Filled 2014-12-14: qty 1

## 2014-12-14 MED ORDER — DOCUSATE SODIUM 100 MG PO CAPS
100.0000 mg | ORAL_CAPSULE | Freq: Three times a day (TID) | ORAL | Status: DC
  Administered 2014-12-14 – 2014-12-17 (×7): 100 mg via ORAL
  Filled 2014-12-14 (×8): qty 1

## 2014-12-14 MED ORDER — ZOLPIDEM TARTRATE 5 MG PO TABS
5.0000 mg | ORAL_TABLET | Freq: Every evening | ORAL | Status: DC | PRN
  Administered 2014-12-16: 5 mg via ORAL
  Filled 2014-12-14: qty 1

## 2014-12-14 NOTE — Progress Notes (Signed)
I introduced spiritual care services to Renee Coleman and her husband, Renee Coleman.  I affirmed the love that they are showing for their baby, Renee Coleman, by doing all that they are doing for their baby.  They reported good family support.  Her mother is also a Company secretary as are her godparents.  She is relying on prayer and faith to help her through this.     They are aware of ongoing chaplain support.  [address] Renee Coleman, Bcc Pager, [phone]  2:31 PM    12/14/14 1400  Clinical Encounter Type  Visited With Patient;Patient and family together  Visit Type Spiritual support  Referral From Nurse  Spiritual Encounters  Spiritual Needs Emotional

## 2014-12-14 NOTE — Plan of Care (Signed)
Problem: Consults Goal: Birthing Suites Patient Information Press F2 to bring up selections list   Pt < [redacted] weeks EGA     

## 2014-12-14 NOTE — Progress Notes (Signed)
Patient oriented to room, transferred from Labor and delivery.

## 2014-12-14 NOTE — Progress Notes (Signed)
Patient ID: Renee Coleman, female   DOB: [birth year], 34 y.o.   MRN: [id number] IUP AT 22.1 WITH PPROM AND INCOMPETENT CERVIX STILL WITH LEAKAGE IRREG CTX'S UTERUS NONTENDER CONTINUE EXPECTANT MANAGEMENT

## 2014-12-14 NOTE — Progress Notes (Signed)
Pt refused vitals signs due to anxiety because of constipation.

## 2014-12-15 NOTE — Progress Notes (Signed)
Patient ID: Renee Coleman, female   DOB: 1980/11/30, 34 y.o.   MRN: [id number] S: NO LEAKAGE NOW OR CTX'S O: AF VSS      UTERUS NONTENDER      POSITIVE FHT A: IUP AT 22.2 WITH PPROM P: EXP MANAGEMENT

## 2014-12-16 MED ORDER — SODIUM CHLORIDE 0.9 % IJ SOLN
3.0000 mL | Freq: Two times a day (BID) | INTRAMUSCULAR | Status: DC
  Administered 2014-12-16 – 2014-12-17 (×3): 3 mL via INTRAVENOUS

## 2014-12-16 NOTE — Progress Notes (Signed)
Patient ID: Renee Coleman, female   DOB: 19-Sep-1980, 34 y.o.   MRN: [id number] S: SOME CONTRACTIONS LAST PM NOW RESOLVED STILL LEAKAGE : AF VSS   UTERUS NONTENDER A: IUP AT 22.3 WITH PPROM P: EXP MANAGEMENT

## 2014-12-16 NOTE — Progress Notes (Signed)
Patient c/o contraction with a score of 4/10 lasting approximately 4 seconds and indigestion. Two Calcium Antiacid tablets administered as ordered. We will continue to monitor.

## 2014-12-17 LAB — CBC WITH DIFFERENTIAL/PLATELET
BASOS PCT: 0 %
Basophils Absolute: 0 10*3/uL (ref 0.0–0.1)
EOS ABS: 0.3 10*3/uL (ref 0.0–0.7)
Eosinophils Relative: 2 %
HCT: 30.6 % — ABNORMAL LOW (ref 36.0–46.0)
HEMOGLOBIN: 10.2 g/dL — AB (ref 12.0–15.0)
Lymphocytes Relative: 15 %
Lymphs Abs: 2.2 10*3/uL (ref 0.7–4.0)
MCH: 28.6 pg (ref 26.0–34.0)
MCHC: 33.3 g/dL (ref 30.0–36.0)
MCV: 85.7 fL (ref 78.0–100.0)
MONOS PCT: 6 %
Monocytes Absolute: 0.8 10*3/uL (ref 0.1–1.0)
NEUTROS PCT: 77 %
Neutro Abs: 11.1 10*3/uL — ABNORMAL HIGH (ref 1.7–7.7)
PLATELETS: 295 10*3/uL (ref 150–400)
RBC: 3.57 MIL/uL — ABNORMAL LOW (ref 3.87–5.11)
RDW: 14.4 % (ref 11.5–15.5)
WBC: 14.4 10*3/uL — AB (ref 4.0–10.5)

## 2014-12-17 LAB — TYPE AND SCREEN
ABO/RH(D): O POS
ANTIBODY SCREEN: NEGATIVE

## 2014-12-17 MED ORDER — MAGNESIUM SULFATE BOLUS VIA INFUSION
4.0000 g | Freq: Once | INTRAVENOUS | Status: AC
  Administered 2014-12-17: 4 g via INTRAVENOUS
  Filled 2014-12-17: qty 500

## 2014-12-17 MED ORDER — BETAMETHASONE SOD PHOS & ACET 6 (3-3) MG/ML IJ SUSP
12.0000 mg | INTRAMUSCULAR | Status: DC
Start: 2014-12-17 — End: 2014-12-18
  Administered 2014-12-17: 12 mg via INTRAMUSCULAR
  Filled 2014-12-17 (×2): qty 2

## 2014-12-17 MED ORDER — MAGNESIUM SULFATE 50 % IJ SOLN
3.0000 g/h | INTRAVENOUS | Status: DC
  Administered 2014-12-17: 2 g/h via INTRAVENOUS
  Filled 2014-12-17: qty 80

## 2014-12-17 MED ORDER — TERBUTALINE SULFATE 1 MG/ML IJ SOLN
0.2500 mg | Freq: Once | INTRAMUSCULAR | Status: AC
  Administered 2014-12-17: 0.25 mg via SUBCUTANEOUS
  Filled 2014-12-17: qty 1

## 2014-12-17 MED ORDER — HYDROCODONE-ACETAMINOPHEN 5-325 MG PO TABS
1.0000 | ORAL_TABLET | Freq: Once | ORAL | Status: AC
  Administered 2014-12-17: 1 via ORAL
  Filled 2014-12-17 (×2): qty 1

## 2014-12-17 MED ORDER — FAMOTIDINE 20 MG PO TABS
20.0000 mg | ORAL_TABLET | Freq: Two times a day (BID) | ORAL | Status: DC
  Administered 2014-12-17: 20 mg via ORAL
  Filled 2014-12-17 (×2): qty 1

## 2014-12-17 NOTE — Progress Notes (Signed)
Patient wanted to wait and take the Vicodin when Dr. Lynnette Caffey arrives.  Stated the contractions are more frequent.  Dr. Lynnette Caffey notified.  Dr. Lynnette Caffey stated that the patient should go ahead and take the pain medicine and evaluate for improvement.  She will be in to see the patient.

## 2014-12-17 NOTE — Progress Notes (Signed)
Patient vomited 150 ml.  Patient stated that she vomited last night after having indigestion and burping.  Tums were given last night and she vomited.  Patient stated that it "came on suddenly this morning and has no nausea."  Dr. Lynnette Caffey notified.  Gave order for patient to receive Zantac 150 mg twice a day.

## 2014-12-17 NOTE — Plan of Care (Signed)
Problem: Phase I Progression Outcomes Goal: OOB as tolerated unless otherwise ordered Outcome: Not Applicable Date Met:  11/11/00 Pt on bedrest

## 2014-12-17 NOTE — Progress Notes (Signed)
Followed up with patient and her husband.  Pt stated she was doing well today and didn't want to talk.  She is aware of spiritual care services.  Will continue to follow.  [place], Chaplain   12/17/14 1535  Clinical Encounter Type  Visited With Patient  Visit Type Spiritual support

## 2014-12-17 NOTE — Progress Notes (Signed)
Scant amount of light pink leakage noted on peri-pad. Pt denies any pain at this time, peri-pad changed and patient continue on bedrest. We will continue to monitor.

## 2014-12-17 NOTE — Progress Notes (Signed)
Patient reports contraction pain 7/10.  Stated it is "worse than it has been today since Dr. Lynnette Caffey was here."  Scant discharge.  Dr. Lynnette Caffey notified and gave order for patient to have one dose of Vicodin, place on toco, and will be over to see patient.  Patient currently on toco.

## 2014-12-17 NOTE — Progress Notes (Signed)
Terbutaline 0.25 mg SQ administered as ordered. HR 69 . We will continue to monitor.

## 2014-12-17 NOTE — Progress Notes (Signed)
Patient still feeling CTX; now q 15 min and rating 4/10.   Will start magnesium sulfate 4/2 through steroid maturity.  Linda Hedges, DO

## 2014-12-17 NOTE — Progress Notes (Signed)
In to see pt after c/o increased pain with CTX.  30 minutes after receiving Vicodin 5, pt reports decrease from 7/10 pain to 3/10 and decrease in frequency.   Scant pink/tan tinged fluid on pad  Plan discussed with patient that should her CTX recur, will start magnesium sulfate for tocolysis through steroids and neuroprotection.  Also, they are again asked about the gestational age at which they would want emergent C/S for fetal distress and they are considering 23 weeks based on NICU consult information and counseling about classical incision.    Linda Hedges, DO

## 2014-12-17 NOTE — Progress Notes (Signed)
Patient reports mild cramping; started last night and has decreased in intensity and frequency this am.  No abd pain.  Light pink fluid this am.  +FM.  Requests to start [place] a day early today.  VSS. AF. Gen: A&O x 3 Abd: soft, NT Ext: no c/c/e  IUP at [redacted]w[redacted]d with PPROM, cerclage -Check CBC -Give BMZ #1 today -Emergent C/S no earlier than 24 weeks -s/p abx and indocin -s/p NICU -Monitor closely for signs of chorio, abruption, labor  Linda Hedges, DO

## 2014-12-17 NOTE — Progress Notes (Signed)
Patient vomited 75 ml.  Dr. Lynnette Caffey notified.  Gave order that patient may sit-up (semi-fowler) position for approximately 10 minutes after eating to avoid reflux.

## 2014-12-18 ENCOUNTER — Inpatient Hospital Stay (HOSPITAL_COMMUNITY): Payer: BC Managed Care – PPO

## 2014-12-18 ENCOUNTER — Inpatient Hospital Stay (HOSPITAL_COMMUNITY): Payer: BC Managed Care – PPO | Admitting: Anesthesiology

## 2014-12-18 ENCOUNTER — Encounter (HOSPITAL_COMMUNITY): Payer: Self-pay

## 2014-12-18 DIAGNOSIS — IMO0001 Reserved for inherently not codable concepts without codable children: Secondary | ICD-10-CM

## 2014-12-18 MED ORDER — ONDANSETRON HCL 4 MG/2ML IJ SOLN: 4.0000 mg | INTRAMUSCULAR | Status: DC | PRN

## 2014-12-18 MED ORDER — LACTATED RINGERS IV SOLN
500.0000 mL | INTRAVENOUS | Status: DC | PRN
  Administered 2014-12-18: 500 mL via INTRAVENOUS

## 2014-12-18 MED ORDER — SODIUM CHLORIDE 0.9 % IV SOLN: 2.0000 g | Freq: Once | INTRAVENOUS | Status: DC

## 2014-12-18 MED ORDER — ONDANSETRON HCL 4 MG/2ML IJ SOLN: 4.0000 mg | Freq: Four times a day (QID) | INTRAMUSCULAR | Status: DC | PRN

## 2014-12-18 MED ORDER — SIMETHICONE 80 MG PO CHEW: 80.0000 mg | CHEWABLE_TABLET | ORAL | Status: DC | PRN

## 2014-12-18 MED ORDER — OXYCODONE-ACETAMINOPHEN 5-325 MG PO TABS: 2.0000 | ORAL_TABLET | ORAL | Status: DC | PRN

## 2014-12-18 MED ORDER — ACETAMINOPHEN 325 MG PO TABS
650.0000 mg | ORAL_TABLET | ORAL | Status: DC | PRN
Start: 2014-12-18 — End: 2014-12-20

## 2014-12-18 MED ORDER — LIDOCAINE HCL (PF) 1 % IJ SOLN
INTRAMUSCULAR | Status: DC | PRN
  Administered 2014-12-18 (×2): 5 mL

## 2014-12-18 MED ORDER — LIDOCAINE HCL (PF) 1 % IJ SOLN
30.0000 mL | INTRAMUSCULAR | Status: DC | PRN
  Filled 2014-12-18: qty 30

## 2014-12-18 MED ORDER — DIPHENHYDRAMINE HCL 25 MG PO CAPS: 25.0000 mg | ORAL_CAPSULE | Freq: Four times a day (QID) | ORAL | Status: DC | PRN

## 2014-12-18 MED ORDER — PHENYLEPHRINE 40 MCG/ML (10ML) SYRINGE FOR IV PUSH (FOR BLOOD PRESSURE SUPPORT)
PREFILLED_SYRINGE | INTRAVENOUS | Status: AC
  Filled 2014-12-18: qty 20

## 2014-12-18 MED ORDER — CITRIC ACID-SODIUM CITRATE 334-500 MG/5ML PO SOLN: 30.0000 mL | ORAL | Status: DC | PRN

## 2014-12-18 MED ORDER — SODIUM CHLORIDE 0.9 % IV SOLN
3.0000 g | Freq: Once | INTRAVENOUS | Status: AC
  Administered 2014-12-18: 3 g via INTRAVENOUS
  Filled 2014-12-18: qty 3

## 2014-12-18 MED ORDER — FENTANYL 2.5 MCG/ML BUPIVACAINE 1/10 % EPIDURAL INFUSION (WH - ANES): 14.0000 mL/h | INTRAMUSCULAR | Status: DC | PRN

## 2014-12-18 MED ORDER — FENTANYL 2.5 MCG/ML BUPIVACAINE 1/10 % EPIDURAL INFUSION (WH - ANES)
INTRAMUSCULAR | Status: AC
  Filled 2014-12-18: qty 125

## 2014-12-18 MED ORDER — ZOLPIDEM TARTRATE 5 MG PO TABS: 5.0000 mg | ORAL_TABLET | Freq: Every evening | ORAL | Status: DC | PRN

## 2014-12-18 MED ORDER — BENZOCAINE-MENTHOL 20-0.5 % EX AERO: 1.0000 "application " | INHALATION_SPRAY | CUTANEOUS | Status: DC | PRN

## 2014-12-18 MED ORDER — FENTANYL 2.5 MCG/ML BUPIVACAINE 1/10 % EPIDURAL INFUSION (WH - ANES)
INTRAMUSCULAR | Status: DC | PRN
  Administered 2014-12-18: 14 mL/h via EPIDURAL

## 2014-12-18 MED ORDER — SODIUM CHLORIDE 0.9 % IV SOLN
2.0000 g | Freq: Four times a day (QID) | INTRAVENOUS | Status: DC
  Administered 2014-12-18: 2 g via INTRAVENOUS
  Filled 2014-12-18 (×2): qty 2000

## 2014-12-18 MED ORDER — OXYTOCIN 40 UNITS IN LACTATED RINGERS INFUSION - SIMPLE MED: 62.5000 mL/h | INTRAVENOUS | Status: DC

## 2014-12-18 MED ORDER — LACTATED RINGERS IV SOLN: 500.0000 mL | INTRAVENOUS | Status: DC | PRN

## 2014-12-18 MED ORDER — DIPHENHYDRAMINE HCL 50 MG/ML IJ SOLN: 12.5000 mg | INTRAMUSCULAR | Status: DC | PRN

## 2014-12-18 MED ORDER — EPHEDRINE 5 MG/ML INJ: 10.0000 mg | INTRAVENOUS | Status: DC | PRN

## 2014-12-18 MED ORDER — FLEET ENEMA 7-19 GM/118ML RE ENEM: 1.0000 | ENEMA | RECTAL | Status: DC | PRN

## 2014-12-18 MED ORDER — ACETAMINOPHEN 325 MG PO TABS: 650.0000 mg | ORAL_TABLET | ORAL | Status: DC | PRN

## 2014-12-18 MED ORDER — TETANUS-DIPHTH-ACELL PERTUSSIS 5-2.5-18.5 LF-MCG/0.5 IM SUSP
0.5000 mL | Freq: Once | INTRAMUSCULAR | Status: DC
Start: 2014-12-19 — End: 2014-12-20

## 2014-12-18 MED ORDER — LACTATED RINGERS IV SOLN: INTRAVENOUS | Status: DC

## 2014-12-18 MED ORDER — LIDOCAINE HCL (PF) 1 % IJ SOLN: 30.0000 mL | INTRAMUSCULAR | Status: DC | PRN

## 2014-12-18 MED ORDER — MAGNESIUM SULFATE 50 % IJ SOLN
2.0000 g/h | INTRAMUSCULAR | Status: DC
Start: 2014-12-18 — End: 2014-12-18
  Administered 2014-12-18: 2 g/h via INTRAVENOUS
  Filled 2014-12-18: qty 80

## 2014-12-18 MED ORDER — WITCH HAZEL-GLYCERIN EX PADS: 1.0000 | MEDICATED_PAD | CUTANEOUS | Status: DC | PRN

## 2014-12-18 MED ORDER — OXYCODONE-ACETAMINOPHEN 5-325 MG PO TABS: 1.0000 | ORAL_TABLET | ORAL | Status: DC | PRN

## 2014-12-18 MED ORDER — ONDANSETRON HCL 4 MG PO TABS: 4.0000 mg | ORAL_TABLET | ORAL | Status: DC | PRN

## 2014-12-18 MED ORDER — DIBUCAINE 1 % RE OINT
1.0000 | TOPICAL_OINTMENT | RECTAL | Status: DC | PRN
Start: 2014-12-18 — End: 2014-12-20

## 2014-12-18 MED ORDER — IBUPROFEN 600 MG PO TABS
600.0000 mg | ORAL_TABLET | Freq: Four times a day (QID) | ORAL | Status: DC
  Administered 2014-12-18 – 2014-12-20 (×8): 600 mg via ORAL
  Filled 2014-12-18 (×8): qty 1

## 2014-12-18 MED ORDER — SENNOSIDES-DOCUSATE SODIUM 8.6-50 MG PO TABS
2.0000 | ORAL_TABLET | ORAL | Status: DC
  Administered 2014-12-18 – 2014-12-19 (×2): 2 via ORAL
  Filled 2014-12-18 (×2): qty 2

## 2014-12-18 MED ORDER — PRENATAL MULTIVITAMIN CH
1.0000 | ORAL_TABLET | Freq: Every day | ORAL | Status: DC
  Administered 2014-12-18 – 2014-12-19 (×2): 1 via ORAL
  Filled 2014-12-18 (×2): qty 1

## 2014-12-18 MED ORDER — OXYTOCIN 40 UNITS IN LACTATED RINGERS INFUSION - SIMPLE MED
62.5000 mL/h | INTRAVENOUS | Status: DC
  Administered 2014-12-18: 62.5 mL/h via INTRAVENOUS
  Filled 2014-12-18: qty 1000

## 2014-12-18 MED ORDER — OXYCODONE-ACETAMINOPHEN 5-325 MG PO TABS
1.0000 | ORAL_TABLET | ORAL | Status: DC | PRN
Start: 2014-12-18 — End: 2014-12-18

## 2014-12-18 MED ORDER — LANOLIN HYDROUS EX OINT: TOPICAL_OINTMENT | CUTANEOUS | Status: DC | PRN

## 2014-12-18 MED ORDER — PHENYLEPHRINE 40 MCG/ML (10ML) SYRINGE FOR IV PUSH (FOR BLOOD PRESSURE SUPPORT)
80.0000 ug | PREFILLED_SYRINGE | INTRAVENOUS | Status: DC | PRN
  Administered 2014-12-18 (×2): 80 ug via INTRAVENOUS

## 2014-12-18 MED ORDER — OXYTOCIN BOLUS FROM INFUSION: 500.0000 mL | INTRAVENOUS | Status: DC

## 2014-12-18 MED ORDER — OXYTOCIN BOLUS FROM INFUSION
500.0000 mL | INTRAVENOUS | Status: DC
  Administered 2014-12-18: 500 mL via INTRAVENOUS

## 2014-12-18 MED ORDER — BUTORPHANOL TARTRATE 1 MG/ML IJ SOLN
1.0000 mg | Freq: Once | INTRAMUSCULAR | Status: AC
  Administered 2014-12-18: 1 mg via INTRAVENOUS
  Filled 2014-12-18: qty 1

## 2014-12-18 NOTE — Progress Notes (Signed)
Attempted to follow up with patient spiritual services has been following on Antenatal unit.  Pt delivered her son this morning and he is in the NICU.  Pt was meeting with the lactation consultant at the time and a family member answered the door.  Will continue to follow, but please page as needs arise.  Karsten Ro Broadwell, [place]     12/18/14 1145  Clinical Encounter Type  Visited With Family;Patient not available  Visit Type Follow-up;Spiritual support  Referral From Chaplain  Spiritual Encounters  Spiritual Needs Emotional  Stress Factors  Patient Stress Factors Loss of control;Major life changes

## 2014-12-18 NOTE — Progress Notes (Signed)
Patient had rest tonight after receiving stadol x 1 dose. She woke up with intense contractions and vaginal bleeding. I arrived to bedside where patient is clearly laboring. Bedside u/s is performed with vertex presenting and low in pelvis. Patient transferred to L&D for removal of cerclage and epidural. Patient wants delivery attended by NICU with full intervention.   Linda Hedges, DO

## 2014-12-18 NOTE — Progress Notes (Addendum)
After arrival to L&D, patient was placed in dorsal lithotomy to attempt cerclage removal.  Speculum was placed and fetal parts were visualized; unable to .  Speculum was removed and vaginal exam reveals fetal back/vtx at +2 station.  Patient strongly desires epidural.  Dr. Tresa Moore called and placed epidural in side-lying position.  Patient was comfortable and NICU team called.  The patient delivered the baby in 3 contractions.  Cord was clamped, cut and baby to NICU.  Cord pH was obtained.  There was not enough venous blood to draw and send.  After 30 minutes of attempting placental delivery, manual extraction was performed in 2 pieces.  Manual sweep revealed no remaining products; however multiple fibroids make evaluation difficult.  Fundus was firmed with pitocin and massage.  Perineum intact.  Speculum was placed and cerclage was removed.  Entire cervix was inspected and without laceration or bleeding.  Will give 1 pp dose of abx secondary to manual removal.  Ultrasound will be performed with rule out retained products.    Linda Hedges, DO

## 2014-12-18 NOTE — Lactation Note (Addendum)
This note was copied from the chart of [place]. Lactation Consultation Note  Patient Name: Renee Coleman [id number] Date: 12/18/2014 Reason for consult: Initial assessment;NICU baby  NICU baby 15 hours old. Mom states that she has a personal pump at home. Started mom pumping and demonstrated hand expression. Discussed normal progression of milk coming to volume. Discussed labeling of EBM and enc taking/sending to NICU. Discussed offering STS/Kangaroo care and nuzzling/latching as baby able. Mom had room full of female family members that mom allowed to stay in room while education and assistance took place. Mom given NICU booklet and [place] brochure with review, aware of OP/BFSG, community resources, and [facility] phone line assistance after D/C.  Maternal Data Has patient been taught Hand Expression?: Yes Does the patient have breastfeeding experience prior to this delivery?: No  Feeding    LATCH Score/Interventions                      Lactation Tools Discussed/Used Pump Review: Setup, frequency, and cleaning;Milk Storage Initiated by:: JW Date initiated:: 12/18/14   Consult Status Consult Status: Follow-up Date: 12/19/14 Follow-up type: In-patient    Inocente Salles 12/18/2014, 11:55 AM

## 2014-12-18 NOTE — Progress Notes (Signed)
Post Partum Day 0 Subjective: no complaints, up ad lib, voiding and tolerating PO  Objective: Blood pressure 115/70, pulse 72, temperature 98.6 F (37 C), temperature source Oral, resp. rate 18, height 5' (1.524 m), weight 123 lb (55.792 kg), last menstrual period 07/13/2014, SpO2 100 %.  Physical Exam:  General: alert and cooperative Lochia: appropriate Uterine Fundus: firm Incision: perineum intact DVT Evaluation: No evidence of DVT seen on physical exam. Negative Homan's sign. No cords or calf tenderness. No significant calf/ankle edema.   Recent Labs  12/17/14 0910  HGB 10.2*  HCT 30.6*  ultrasound revealed no evidence of retained POC's  Assessment/Plan: Plan for discharge tomorrow   LOS: 7 days   Renee Coleman 12/18/2014, 8:44 AM

## 2014-12-18 NOTE — Anesthesia Postprocedure Evaluation (Signed)
  Anesthesia Post-op Note  Patient: Renee Coleman  Procedure(s) Performed: * No procedures listed *  Patient Location: [facility] Unit  Anesthesia Type:Epidural  Level of Consciousness: awake  Airway and Oxygen Therapy: Patient Spontanous Breathing  Post-op Pain: mild  Post-op Assessment: Patient's Cardiovascular Status Stable and Respiratory Function Stable              Post-op Vital Signs: stable  Last Vitals:  Filed Vitals:   12/18/14 1240  BP: 120/78  Pulse: 80  Temp: 36.6 C  Resp: 18    Complications: No apparent anesthesia complications

## 2014-12-18 NOTE — Plan of Care (Signed)
Problem: Consults Goal: Birthing Suites Patient Information Press F2 to bring up selections list Outcome: Completed/Met Date Met:  12/18/14  Pt < [redacted] weeks EGA and Fetal indication

## 2014-12-18 NOTE — Anesthesia Procedure Notes (Signed)
Epidural Patient location during procedure: OB Start time: 12/18/2014 5:05 AM End time: 12/18/2014 5:23 AM  Staffing Anesthesiologist: Alexis Frock  Preanesthetic Checklist Completed: patient identified, site marked, surgical consent, pre-op evaluation, timeout performed, IV checked, risks and benefits discussed and monitors and equipment checked  Epidural Patient position: right lateral decubitus Prep: DuraPrep Patient monitoring: heart rate, continuous pulse ox and blood pressure Approach: midline Location: L2-L3 Injection technique: LOR air  Needle:  Needle type: Tuohy  Needle gauge: 17 G Needle length: 9 cm and 9 Needle insertion depth: 5 cm Catheter type: closed end flexible Catheter size: 20 Guage Catheter at skin depth: 14 cm Test dose: negative  Assessment Events: blood not aspirated, injection not painful, no injection resistance, negative IV test and no paresthesia  Additional Notes   Patient tolerated the insertion well without complications.Reason for block:procedure for pain

## 2014-12-18 NOTE — Plan of Care (Signed)
Problem: Discharge Progression Outcomes Goal: Prior to transfer discontinue epidural Outcome: Not Met (add Reason) Epidural catheter left in place until u/s is resulted

## 2014-12-18 NOTE — Anesthesia Preprocedure Evaluation (Signed)
Anesthesia Evaluation  Patient identified by MRN, date of birth, ID band Patient awake and Patient confused    Reviewed: Allergy & Precautions, H&P , NPO status , Patient's Chart, lab work & pertinent test results  Airway Mallampati: II       Dental   Pulmonary    Pulmonary exam normal breath sounds clear to auscultation       Cardiovascular Exercise Tolerance: Good Normal cardiovascular exam Rhythm:regular Rate:Normal     Neuro/Psych    GI/Hepatic   Endo/Other    Renal/GU      Musculoskeletal   Abdominal   Peds  Hematology   Anesthesia Other Findings   Reproductive/Obstetrics (+) Pregnancy 22.5 weeks IUP, IAL                             Anesthesia Physical Anesthesia Plan  ASA: II  Anesthesia Plan: Epidural   Post-op Pain Management:    Induction:   Airway Management Planned:   Additional Equipment:   Intra-op Plan:   Post-operative Plan:   Informed Consent: I have reviewed the patients History and Physical, chart, labs and discussed the procedure including the risks, benefits and alternatives for the proposed anesthesia with the patient or authorized representative who has indicated his/her understanding and acceptance.     Plan Discussed with:   Anesthesia Plan Comments:         Anesthesia Quick Evaluation

## 2014-12-19 LAB — CBC
HEMATOCRIT: 28.6 % — AB (ref 36.0–46.0)
Hemoglobin: 9.5 g/dL — ABNORMAL LOW (ref 12.0–15.0)
MCH: 28.9 pg (ref 26.0–34.0)
MCHC: 33.2 g/dL (ref 30.0–36.0)
MCV: 86.9 fL (ref 78.0–100.0)
Platelets: 312 10*3/uL (ref 150–400)
RBC: 3.29 MIL/uL — ABNORMAL LOW (ref 3.87–5.11)
RDW: 14.6 % (ref 11.5–15.5)
WBC: 17.7 10*3/uL — ABNORMAL HIGH (ref 4.0–10.5)

## 2014-12-19 LAB — RPR: RPR Ser Ql: NONREACTIVE

## 2014-12-19 NOTE — Lactation Note (Signed)
This note was copied from the chart of [place]. Lactation Consultation Note  Patient Name: Renee Coleman [id number] Date: 12/19/2014 Reason for consult: Follow-up assessment NICU baby 50 hours old, [redacted]w[redacted]d CGA. Mom using DEBP when this [place] entered room. Reviewed hand expression and normal progression of milk coming to volume. Mom pumped 5 times yesterday, enc mom to pump at least 8 times/24 hours for 15 minutes followed by hand expression.   Maternal Data    Feeding    LATCH Score/Interventions                      Lactation Tools Discussed/Used     Consult Status Consult Status: Follow-up Date: 12/20/14 Follow-up type: In-patient    Inocente Salles 12/19/2014, 8:48 AM

## 2014-12-19 NOTE — Progress Notes (Signed)
CSW attempted to meet with parents to introduce services, offer support and complete assessment due to baby's admission to NICU at 22.5 weeks, but was told that MOB was sleeping at this time.  CSW will attempt again tomorrow prior to MOB's discharge.

## 2014-12-19 NOTE — Progress Notes (Signed)
Post Partum Day 1 Subjective: no complaints, up ad lib, voiding, tolerating PO, + flatus and baby stable in NICU  Objective: Blood pressure 115/75, pulse 76, temperature 97.9 F (36.6 C), temperature source Oral, resp. rate 16, height 5' (1.524 m), weight 123 lb (55.792 kg), last menstrual period 07/13/2014, SpO2 99 %, unknown if currently breastfeeding.  Physical Exam:  General: alert and cooperative Lochia: appropriate Uterine Fundus: firm Incision: perineum intact DVT Evaluation: No evidence of DVT seen on physical exam. Negative Homan's sign. No cords or calf tenderness. No significant calf/ankle edema.   Recent Labs  12/17/14 0910 12/19/14 0530  HGB 10.2* 9.5*  HCT 30.6* 28.6*    Assessment/Plan: Plan for discharge tomorrow   LOS: 8 days   Renee Coleman 12/19/2014, 8:38 AM

## 2014-12-20 MED ORDER — INFLUENZA VAC SPLIT QUAD 0.5 ML IM SUSY: 0.5000 mL | PREFILLED_SYRINGE | INTRAMUSCULAR | Status: DC

## 2014-12-20 MED ORDER — IBUPROFEN 600 MG PO TABS: 600.0000 mg | ORAL_TABLET | Freq: Four times a day (QID) | ORAL | Status: DC

## 2014-12-20 NOTE — Progress Notes (Signed)
Pt is discharged in the care of husband. Downstairs per ambulatory with R,N, escort. Denies pain or discomfort. No equipment needed for home use. Infant to remain in [place]. Discharged instructions given with good understanding Questions asked and answered.

## 2014-12-20 NOTE — Discharge Summary (Signed)
Obstetric Discharge Summary Reason for Admission: rupture of membranes Prenatal Procedures: ultrasound Intrapartum Procedures: spontaneous vaginal delivery Postpartum Procedures: manual removal of placenta Complications-Operative and Postpartum: none HEMOGLOBIN  Date Value Ref Range Status  12/19/2014 9.5* 12.0 - 15.0 g/dL Final   HCT  Date Value Ref Range Status  12/19/2014 28.6* 36.0 - 46.0 % Final    Physical Exam:  General: alert and cooperative Lochia: appropriate Uterine Fundus: firm Incision: perineum intact DVT Evaluation: No evidence of DVT seen on physical exam. Negative Homan's sign. No cords or calf tenderness. No significant calf/ankle edema.  Discharge Diagnoses: PPROM with spont vag delilvery at [redacted] weeks gestation  Discharge Information: Date: 12/20/2014 Activity: pelvic rest Diet: routine Medications: PNV and Ibuprofen Condition: stable Instructions: refer to practice specific booklet Discharge to: home   Newborn Data: Live born female  Birth Weight: 1 lb 0.6 oz (471 g) APGAR: 4, 7  Baby in [place] 12/20/2014, 8:27 AM

## 2014-12-20 NOTE — Lactation Note (Signed)
This note was copied from the chart of [place]. Lactation Consultation Note  Patient Name: Renee Coleman [id number]'[id number] Date: 12/20/2014 Reason for consult: Follow-up assessment;NICU baby  NICU baby 40 hours old. Mom on her way to NICU to take EBM. Mom is pumping almost 10 mls each time now. Mom is going to be D/C'd today, has personal DEBP, aware of DEBP in NICU. OP/BFSG, and [place] phone line assistance after D/C. Maternal Data    Feeding    LATCH Score/Interventions                      Lactation Tools Discussed/Used     Consult Status Consult Status: PRN    Inocente Salles 12/20/2014, 8:55 AM

## 2015-02-09 ENCOUNTER — Emergency Department (HOSPITAL_COMMUNITY)
Admission: EM | Admit: 2015-02-09 | Discharge: 2015-02-09 | Disposition: A | Discharge status: Home / Self Care | Payer: BC Managed Care – PPO | Source: Home / Self Care | Attending: Family Medicine | Admitting: Family Medicine

## 2015-02-09 ENCOUNTER — Encounter (HOSPITAL_COMMUNITY): Payer: Self-pay | Admitting: *Deleted

## 2015-02-09 DIAGNOSIS — H6123 Impacted cerumen, bilateral: Secondary | ICD-10-CM | POA: Diagnosis not present

## 2015-02-09 NOTE — ED Notes (Signed)
Ear  Wax removal  Per  Dr  Juventino Slovak

## 2015-02-09 NOTE — ED Notes (Signed)
r  Earache      X  3  Days      Symptoms  Not  releived    By  Ear  Wax  Removal  Kit

## 2015-02-09 NOTE — ED Provider Notes (Signed)
CSN: [id number]     Arrival date & time 02/09/15  1801 History   First MD Initiated Contact with Patient 02/09/15 1843     Chief Complaint  Patient presents with  . Otalgia   (Consider location/radiation/quality/duration/timing/severity/associated sxs/prior Treatment) Patient is a 34 y.o. female presenting with ear pain. The history is provided by the patient.  Otalgia Location:  Right Behind ear:  No abnormality Quality:  Throbbing and pressure Severity:  Mild Onset quality:  Gradual Duration:  3 days Progression:  Unchanged (onset after attempt at cerumen removal.) Associated symptoms: hearing loss   Associated symptoms: no congestion     Past Medical History  Diagnosis Date  . Medical history non-contributory    Past Surgical History  Procedure Laterality Date  . No past surgeries    . Cervical cerclage N/A 11/28/2014    Procedure: CERCLAGE CERVICAL;  Surgeon: Marylynn Pearson, MD;  Location: [facility];  Service: Gynecology;  Laterality: N/A;   Family History  Problem Relation Age of Onset  . Miscarriages / Stillbirths Maternal Aunt   . Cancer Maternal Grandmother   . Stroke Paternal Grandmother    Social History  Substance Use Topics  . Smoking status: Never Smoker   . Smokeless tobacco: Never Used  . Alcohol Use: 0.6 oz/week    1 Glasses of wine per week     Comment: 1-2 drinks per day before preg   OB History    Gravida Para Term Preterm AB TAB SAB Ectopic Multiple Living   1 1  1      0 1     Review of Systems  Constitutional: Negative.   HENT: Positive for ear pain and hearing loss. Negative for congestion and postnasal drip.     Allergies  Review of patient's allergies indicates no known allergies.  Home Medications   Prior to Admission medications   Medication Sig Start Date End Date Taking? Authorizing Provider  ibuprofen (ADVIL,MOTRIN) 600 MG tablet Take 1 tablet (600 mg total) by mouth every 6 (six) hours. 12/20/14   Juanda Chance, NP  Influenza  vac split quadrivalent PF (FLUARIX) 0.5 ML injection Inject 0.5 mLs into the muscle tomorrow at 10 am. 12/21/14   Juanda Chance, NP  Prenatal Vit-Fe Fumarate-FA (PRENATAL MULTIVITAMIN) TABS tablet Take 1 tablet by mouth daily at 12 noon.    Historical Provider, MD   Meds Ordered and Administered this Visit  Medications - No data to display  BP 159/112 mmHg  Pulse 75  Temp(Src) 98.7 F (37.1 C) (Oral)  SpO2 100% No data found.   Physical Exam  Constitutional: She is oriented to person, place, and time. She appears well-developed and well-nourished. She appears distressed.  HENT:  Head: Normocephalic.  Right Ear: External ear normal.  Left Ear: External ear normal.  Mouth/Throat: Oropharynx is clear and moist.  bilat cerumen impaction .  Eyes: Conjunctivae and EOM are normal. Pupils are equal, round, and reactive to light.  Neck: Normal range of motion. Neck supple.  Lymphadenopathy:    She has no cervical adenopathy.  Neurological: She is alert and oriented to person, place, and time.  Skin: Skin is warm and dry.  Nursing note and vitals reviewed.   ED Course  Procedures (including critical care time)  Labs Review Labs Reviewed - No data to display  Imaging Review No results found.   Visual Acuity Review  Right Eye Distance:   Left Eye Distance:   Bilateral Distance:    Right Eye Near:  Left Eye Near:    Bilateral Near:         MDM   1. Cerumen impaction, bilateral    Sx improved , nearly clear after irrig.bilat.    Billy Fischer, MD 02/09/15 [phone]

## 2015-03-10 NOTE — L&D Delivery Note (Signed)
Delivery Note At 5:07 PM a viable female was delivered via Vaginal, Spontaneous Delivery (Presentation: compound presentation - vertex with hand right next to face).  APGAR: pending; weight pending  Placenta status: routine, not to path No complications  Anesthesia:   Episiotomy:  none Lacerations: 2nd degree Suture Repair: 2.0 3.0 vicryl rapide Est. Blood Loss (mL):  200cc  Mom to postpartum.  Baby to Couplet care / Skin to Skin. It's a girl, "zoey"!!!! FOB is [place].   Tyson Dense 11/04/2015, 5:33 PM

## 2015-05-01 LAB — OB RESULTS CONSOLE RUBELLA ANTIBODY, IGM: Rubella: IMMUNE

## 2015-05-01 LAB — OB RESULTS CONSOLE GC/CHLAMYDIA
CHLAMYDIA, DNA PROBE: NEGATIVE
Gonorrhea: NEGATIVE

## 2015-05-01 LAB — OB RESULTS CONSOLE HEPATITIS B SURFACE ANTIGEN: HEP B S AG: NEGATIVE

## 2015-05-01 LAB — OB RESULTS CONSOLE HIV ANTIBODY (ROUTINE TESTING): HIV: NONREACTIVE

## 2015-05-01 LAB — OB RESULTS CONSOLE RPR: RPR: NONREACTIVE

## 2015-05-08 NOTE — H&P (Addendum)
Renee Coleman is a 35 y.o. female G1P0 presenting for cerclage.   Previous pregnancy complicated by cervical incompetence requiring rescue cerclage - previable delivery.  Pt with h/o fibroids.   History OB History    Gravida Para Term Preterm AB TAB SAB Ectopic Multiple Living   1 1  1      0 1     Past Medical History  Diagnosis Date  . Medical history non-contributory    Past Surgical History  Procedure Laterality Date  . No past surgeries    . Cervical cerclage N/A 11/28/2014    Procedure: CERCLAGE CERVICAL;  Surgeon: Marylynn Pearson, MD;  Location: [facility];  Service: Gynecology;  Laterality: N/A;   Family History: family history includes Cancer in her maternal grandmother; Miscarriages / Stillbirths in her maternal aunt; Stroke in her paternal grandmother. Social History:  reports that she has never smoked. She has never used smokeless tobacco. She reports that she drinks about 0.6 oz of alcohol per week. She reports that she does not use illicit drugs.   Prenatal Transfer Tool  Maternal Diabetes: No Genetic Screening: Normal Maternal Ultrasounds/Referrals: Normal Fetal Ultrasounds or other Referrals:  None Maternal Substance Abuse:  No Significant Maternal Medications:  None Significant Maternal Lab Results:  None Other Comments:  None  ROS    unknown if currently breastfeeding. Exam Physical Exam  gen - NAD Abd - gravid, NT Ext - NT, no edema Cvx - closed  Prenatal labs: ABO, Rh: --/--/O POS (10/10 1706) Antibody: NEG (10/10 1706) Rubella: Immune (07/06 0000) RPR: Non Reactive (10/12 0530)  HBsAg: Negative (07/06 0000)  HIV: Non-reactive (07/06 0000)  GBS:     Assessment/Plan: H/o cervical incompetence & fibroids Plan for Prophylactic cerclage - r/b/a discussed including risk of infection, rupture of membranes reviewed, questions answered, informed consent  R/b/a discussed, questions answered, informed consent  Renee Coleman 05/08/2015, 9:18 AM

## 2015-05-13 ENCOUNTER — Encounter (HOSPITAL_COMMUNITY): Payer: Self-pay | Admitting: *Deleted

## 2015-05-14 ENCOUNTER — Inpatient Hospital Stay (HOSPITAL_COMMUNITY): Admission: RE | Admit: 2015-05-14 | Payer: BC Managed Care – PPO | Source: Ambulatory Visit

## 2015-05-24 ENCOUNTER — Encounter (HOSPITAL_COMMUNITY): Payer: Self-pay | Admitting: Anesthesiology

## 2015-05-24 ENCOUNTER — Ambulatory Visit (HOSPITAL_COMMUNITY)
Admission: RE | Admit: 2015-05-24 | Discharge: 2015-05-24 | Disposition: A | Discharge status: Home / Self Care | Payer: BC Managed Care – PPO | Source: Ambulatory Visit | Attending: Obstetrics and Gynecology | Admitting: Obstetrics and Gynecology

## 2015-05-24 ENCOUNTER — Ambulatory Visit (HOSPITAL_COMMUNITY): Payer: BC Managed Care – PPO | Admitting: Anesthesiology

## 2015-05-24 ENCOUNTER — Encounter (HOSPITAL_COMMUNITY)
Admission: RE | Disposition: A | Discharge status: Home / Self Care | Payer: Self-pay | Source: Ambulatory Visit | Attending: Obstetrics and Gynecology

## 2015-05-24 DIAGNOSIS — N883 Incompetence of cervix uteri: Secondary | ICD-10-CM | POA: Diagnosis not present

## 2015-05-24 DIAGNOSIS — D259 Leiomyoma of uterus, unspecified: Secondary | ICD-10-CM | POA: Diagnosis not present

## 2015-05-24 DIAGNOSIS — Z823 Family history of stroke: Secondary | ICD-10-CM | POA: Diagnosis not present

## 2015-05-24 HISTORY — PX: CERVICAL CERCLAGE: SHX1329

## 2015-05-24 LAB — CBC
HEMATOCRIT: 35.2 % — AB (ref 36.0–46.0)
Hemoglobin: 12.1 g/dL (ref 12.0–15.0)
MCH: 28.6 pg (ref 26.0–34.0)
MCHC: 34.4 g/dL (ref 30.0–36.0)
MCV: 83.2 fL (ref 78.0–100.0)
Platelets: 260 10*3/uL (ref 150–400)
RBC: 4.23 MIL/uL (ref 3.87–5.11)
RDW: 14.4 % (ref 11.5–15.5)
WBC: 9.4 10*3/uL (ref 4.0–10.5)

## 2015-05-24 SURGERY — CERCLAGE, CERVIX, VAGINAL APPROACH
Anesthesia: Spinal

## 2015-05-24 MED ORDER — CEFAZOLIN SODIUM-DEXTROSE 2-3 GM-% IV SOLR
INTRAVENOUS | Status: AC
  Filled 2015-05-24: qty 50

## 2015-05-24 MED ORDER — LACTATED RINGERS IV SOLN
INTRAVENOUS | Status: DC | PRN
  Administered 2015-05-24 (×2): via INTRAVENOUS

## 2015-05-24 MED ORDER — BUPIVACAINE IN DEXTROSE 0.75-8.25 % IT SOLN
INTRATHECAL | Status: DC | PRN
  Administered 2015-05-24: 1.2 mg via INTRATHECAL

## 2015-05-24 MED ORDER — LACTATED RINGERS IV SOLN
INTRAVENOUS | Status: DC
  Administered 2015-05-24: 07:00:00 via INTRAVENOUS

## 2015-05-24 MED ORDER — SCOPOLAMINE 1 MG/3DAYS TD PT72: 1.0000 | MEDICATED_PATCH | Freq: Once | TRANSDERMAL | Status: DC

## 2015-05-24 MED ORDER — CEFAZOLIN SODIUM-DEXTROSE 2-3 GM-% IV SOLR
2.0000 g | INTRAVENOUS | Status: AC
  Administered 2015-05-24: 2 g via INTRAVENOUS

## 2015-05-24 MED ORDER — AZITHROMYCIN 250 MG PO TABS: ORAL_TABLET | ORAL | Status: DC

## 2015-05-24 MED ORDER — ONDANSETRON HCL 4 MG/2ML IJ SOLN: 4.0000 mg | Freq: Once | INTRAMUSCULAR | Status: DC | PRN

## 2015-05-24 SURGICAL SUPPLY — 22 items
CLOTH BEACON ORANGE TIMEOUT ST (SAFETY) ×3 IMPLANT
COUNTER NEEDLE 1200 MAGNETIC (NEEDLE) ×3 IMPLANT
GLOVE BIO SURGEON STRL SZ 6.5 (GLOVE) ×2 IMPLANT
GLOVE BIO SURGEONS STRL SZ 6.5 (GLOVE) ×1
GLOVE BIOGEL PI IND STRL 7.0 (GLOVE) ×2 IMPLANT
GLOVE BIOGEL PI INDICATOR 7.0 (GLOVE) ×4
GOWN STRL REUS W/TWL LRG LVL3 (GOWN DISPOSABLE) ×6 IMPLANT
NDL MAYO 6 CRC TAPER PT (NEEDLE) IMPLANT
NDL MAYO CATGUT SZ4 TPR NDL (NEEDLE) IMPLANT
NEEDLE MAYO 6 CRC TAPER PT (NEEDLE) IMPLANT
NEEDLE MAYO CATGUT SZ4 (NEEDLE) ×3 IMPLANT
PACK VAGINAL MINOR WOMEN LF (CUSTOM PROCEDURE TRAY) ×3 IMPLANT
PAD OB MATERNITY 4.3X12.25 (PERSONAL CARE ITEMS) ×3 IMPLANT
PAD PREP 24X48 CUFFED NSTRL (MISCELLANEOUS) ×3 IMPLANT
SUT ETHIBOND  5 (SUTURE) ×2
SUT ETHIBOND 5 (SUTURE) ×1 IMPLANT
TOWEL OR 17X24 6PK STRL BLUE (TOWEL DISPOSABLE) ×6 IMPLANT
TRAY FOLEY CATH SILVER 14FR (SET/KITS/TRAYS/PACK) ×3 IMPLANT
TUBING NON-CON 1/4 X 20 CONN (TUBING) IMPLANT
TUBING NON-CON 1/4 X 20' CONN (TUBING)
WATER STERILE IRR 1000ML POUR (IV SOLUTION) ×3 IMPLANT
YANKAUER SUCT BULB TIP NO VENT (SUCTIONS) IMPLANT

## 2015-05-24 NOTE — Discharge Instructions (Signed)
°  Post Anesthesia Home Care Instructions  Activity: Get plenty of rest for the remainder of the day. A responsible adult should stay with you for 24 hours following the procedure.  For the next 24 hours, DO NOT: -Drive a car -Paediatric nurse -Drink alcoholic beverages -Take any medication unless instructed by your physician -Make any legal decisions or sign important papers.  Meals: Start with liquid foods such as gelatin or soup. Progress to regular foods as tolerated. Avoid greasy, spicy, heavy foods. If nausea and/or vomiting occur, drink only clear liquids until the nausea and/or vomiting subsides. Call your physician if vomiting continues.  Special Instructions/Symptoms: Your throat may feel dry or sore from the anesthesia or the breathing tube placed in your throat during surgery. If this causes discomfort, gargle with warm salt water. The discomfort should disappear within 24 hours.   Cervical Cerclage    Cervical cerclage is a surgical procedure for an incompetent cervix. An incompetent cervix is a weak cervix that opens up before labor begins. Cervical cerclage is a procedure in which the cervix is sewn closed during pregnancy.  LET [facility] CARE PROVIDER KNOW ABOUT:  Any allergies you have.  All medicines you are taking, including vitamins, herbs, eye drops, creams, and over-the-counter medicines.  Previous problems you or members of your family have had with the use of anesthetics.  Any blood disorders you have.  Previous surgeries you have had.  Medical conditions you have.  Any recent colds or infections. RISKS AND COMPLICATIONS  Generally, this is a safe procedure. However, as with any procedure, problems can occur. Possible problems include:  Infection.  Bleeding.  Rupturing the amniotic sac (membranes).  Going into early labor and delivery.  Problems with the anesthetics.  Infection of the amniotic sac. BEFORE THE PROCEDURE  Ask your health care provider  about changing or stopping your medicines.  Do not eat or drink anything for 6-8 hours before the procedure.  Arrange for someone to drive you home after the procedure. PROCEDURE  An IV tube will be placed in your vein. You will be given a sedative to help you relax.  You will be given a medicine that makes you sleep through the procedure (general anesthetic) or a medicine injected into your spine that numbs your body below the waist (spinal or epidural anesthetic). You will be asleep or be numbed through the entire procedure.  A speculum will be placed in your vagina to visualize your cervix.  The cervix is then grasped and stitched closed tightly.  Ultrasound may be used to guide the procedure and monitor the baby. AFTER THE PROCEDURE  You will go to a recovery room where you and your unborn baby are monitored. Once you are awake, stable, and taking fluids well, you will be allowed to return to your room.  You will usually stay in the hospital overnight.  You may get an injection of progesterone to prevent uterine contractions.  You may be given pain-relieving medicines to take with you when you go home.  Have someone drive you home and stay with you for up to 2 days. This information is not intended to replace advice given to you by your health care provider. Make sure you discuss any questions you have with your health care provider.  Document Released: 02/06/2008 Document Revised: 02/28/2013 Document Reviewed: 09/14/2012  Elsevier Interactive Patient Education [employer].

## 2015-05-24 NOTE — Op Note (Signed)
NAME:  SHERRE, WOOLCOTT NO.:  [id number]  MEDICAL RECORD NO.:  [id number]  LOCATION:  [facility]                          FACILITY:  [facility]  PHYSICIAN:  Marylynn Pearson, MD    DATE OF BIRTH:  08-24-1980  DATE OF PROCEDURE:  05/24/2015 DATE OF DISCHARGE:                              OPERATIVE REPORT   PREOPERATIVE DIAGNOSIS:  History of incompetent cervix, previable delivery.  POSTOPERATIVE DIAGNOSIS:  History of incompetent cervix, previable delivery.  PROCEDURE:  Cervical cerclage.  SURGEON:  Marylynn Pearson, M.D.  ANESTHESIA:  Spinal.  COMPLICATIONS:  None.  CONDITION:  Stable to recovery room.  DESCRIPTION OF PROCEDURE:  The patient was taken to the operating room. After informed consent was obtained, she was given spinal anesthesia, placed in dorsal lithotomy position using Allen stirrups.  Prepped and draped in sterile fashion.  In- and out catheter was used to drain her bladder.  A weighted speculum was placed posteriorly and a Deaver was placed to help retract the vaginal sidewalls.  The cervix was closed but soft and appeared quite short.  The anterior lip of the cervix was grasped with a ring forceps and a pursestring suture using Ethibond was used to perform a single McDonald cervical cerclage.  Suture was tied down at the anterior lip of the cervix.  The cervix was hemostatic.  All instruments were removed from the cervix and vagina.  The patient was stable and taken to the recovery room.     Marylynn Pearson, MD     GA/MEDQ  D:  05/24/2015  T:  05/24/2015  Job:  [id number]

## 2015-05-24 NOTE — Anesthesia Preprocedure Evaluation (Signed)
Anesthesia Evaluation  Patient identified by MRN, date of birth, ID band Patient awake    Reviewed: Allergy & Precautions, H&P , NPO status , Patient's Chart, lab work & pertinent test results  History of Anesthesia Complications Negative for: history of anesthetic complications  Airway Mallampati: II  TM Distance: >3 FB Neck ROM: full    Dental no notable dental hx.    Pulmonary neg pulmonary ROS,    Pulmonary exam normal breath sounds clear to auscultation       Cardiovascular negative cardio ROS Normal cardiovascular exam Rhythm:regular Rate:Normal     Neuro/Psych negative neurological ROS     GI/Hepatic negative GI ROS, Neg liver ROS,   Endo/Other  negative endocrine ROS  Renal/GU negative Renal ROS     Musculoskeletal   Abdominal   Peds  Hematology negative hematology ROS (+)   Anesthesia Other Findings   Reproductive/Obstetrics (+) Pregnancy                             Anesthesia Physical Anesthesia Plan  ASA: II  Anesthesia Plan: Spinal   Post-op Pain Management:    Induction: Intravenous  Airway Management Planned:   Additional Equipment:   Intra-op Plan:   Post-operative Plan:   Informed Consent: I have reviewed the patients History and Physical, chart, labs and discussed the procedure including the risks, benefits and alternatives for the proposed anesthesia with the patient or authorized representative who has indicated his/her understanding and acceptance.   Dental Advisory Given  Plan Discussed with: Anesthesiologist, CRNA and Surgeon  Anesthesia Plan Comments:         Anesthesia Quick Evaluation

## 2015-05-24 NOTE — Anesthesia Procedure Notes (Addendum)
Spinal Patient location during procedure: OR Start time: 05/24/2015 7:24 AM End time: 05/24/2015 7:26 AM Reason for block: procedure for pain Staffing Anesthesiologist: Winnie Barsky Performed by: anesthesiologist  Preanesthetic Checklist Completed: patient identified, site marked, surgical consent, pre-op evaluation, timeout performed, IV checked, risks and benefits discussed and monitors and equipment checked Spinal Block Patient position: sitting Prep: DuraPrep Patient monitoring: heart rate Approach: midline Location: L4-5 Injection technique: single-shot Needle Needle type: Pencan  Needle gauge: 24 G Assessment Sensory level: T10 Additional Notes Functioning IV was confirmed and monitors were applied. Sterile prep and drape, including hand hygiene, mask and sterile gloves were used. The patient was positioned and the spine was prepped. The skin was anesthetized with lidocaine.  Free flow of clear CSF was obtained prior to injecting local anesthetic into the CSF.  The spinal needle aspirated freely following injection.  The needle was carefully withdrawn.  The patient tolerated the procedure well. Consent was obtained prior to procedure with all questions answered and concerns addressed.  [place] Pink, MD

## 2015-05-24 NOTE — Anesthesia Postprocedure Evaluation (Signed)
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  Procedure(s) Performed: Procedure(s) (LRB): CERCLAGE CERVICAL (N/A)  Patient location during evaluation: PACU Anesthesia Type: Spinal Level of consciousness: oriented and awake and alert Pain management: pain level controlled Vital Signs Assessment: post-procedure vital signs reviewed and stable Respiratory status: spontaneous breathing, respiratory function stable and patient connected to nasal cannula oxygen Cardiovascular status: blood pressure returned to baseline and stable Postop Assessment: no headache, no backache and spinal receding Anesthetic complications: no    Last Vitals:  Filed Vitals:   05/24/15 1030 05/24/15 1106  BP: 133/95 125/89  Pulse: 71 79  Temp: 36.8 C 36.6 C  Resp: 18 16    Last Pain: There were no vitals filed for this visit.               Renee Coleman

## 2015-05-24 NOTE — Transfer of Care (Signed)
Immediate Anesthesia Transfer of Care Note  Patient: Renee Coleman  Procedure(s) Performed: Procedure(s): CERCLAGE CERVICAL (N/A)  Patient Location: PACU  Anesthesia Type:Spinal  Level of Consciousness: awake, alert , oriented and patient cooperative  Airway & Oxygen Therapy: Patient Spontanous Breathing  Post-op Assessment: Report given to RN and Post -op Vital signs reviewed and stable  Post vital signs: Reviewed and stable  Last Vitals:  Filed Vitals:   05/24/15 0625  BP: 112/78  Pulse: 65  Temp: 36.3 C  Resp: 20    Complications: No apparent anesthesia complications

## 2015-05-27 ENCOUNTER — Encounter (HOSPITAL_COMMUNITY): Payer: Self-pay | Admitting: Obstetrics and Gynecology

## 2015-06-25 ENCOUNTER — Inpatient Hospital Stay (HOSPITAL_COMMUNITY)
Admission: AD | Admit: 2015-06-25 | Discharge: 2015-06-25 | Disposition: A | Discharge status: Home / Self Care | Payer: BC Managed Care – PPO | Source: Ambulatory Visit | Attending: Obstetrics and Gynecology | Admitting: Obstetrics and Gynecology

## 2015-06-25 ENCOUNTER — Encounter (HOSPITAL_COMMUNITY): Payer: Self-pay | Admitting: *Deleted

## 2015-06-25 DIAGNOSIS — Z3A18 18 weeks gestation of pregnancy: Secondary | ICD-10-CM | POA: Diagnosis not present

## 2015-06-25 DIAGNOSIS — N883 Incompetence of cervix uteri: Secondary | ICD-10-CM | POA: Insufficient documentation

## 2015-06-25 DIAGNOSIS — O26899 Other specified pregnancy related conditions, unspecified trimester: Secondary | ICD-10-CM

## 2015-06-25 DIAGNOSIS — O9989 Other specified diseases and conditions complicating pregnancy, childbirth and the puerperium: Secondary | ICD-10-CM

## 2015-06-25 DIAGNOSIS — O26892 Other specified pregnancy related conditions, second trimester: Secondary | ICD-10-CM | POA: Diagnosis not present

## 2015-06-25 DIAGNOSIS — R109 Unspecified abdominal pain: Secondary | ICD-10-CM

## 2015-06-25 DIAGNOSIS — R102 Pelvic and perineal pain: Secondary | ICD-10-CM | POA: Diagnosis not present

## 2015-06-25 DIAGNOSIS — N949 Unspecified condition associated with female genital organs and menstrual cycle: Secondary | ICD-10-CM

## 2015-06-25 DIAGNOSIS — R1031 Right lower quadrant pain: Secondary | ICD-10-CM | POA: Diagnosis present

## 2015-06-25 LAB — CBC WITH DIFFERENTIAL/PLATELET
BASOS ABS: 0 10*3/uL (ref 0.0–0.1)
BASOS PCT: 0 %
Eosinophils Absolute: 0.5 10*3/uL (ref 0.0–0.7)
Eosinophils Relative: 4 %
HEMATOCRIT: 34 % — AB (ref 36.0–46.0)
HEMOGLOBIN: 12.1 g/dL (ref 12.0–15.0)
Lymphocytes Relative: 22 %
Lymphs Abs: 2.9 10*3/uL (ref 0.7–4.0)
MCH: 29.7 pg (ref 26.0–34.0)
MCHC: 35.6 g/dL (ref 30.0–36.0)
MCV: 83.5 fL (ref 78.0–100.0)
Monocytes Absolute: 0.5 10*3/uL (ref 0.1–1.0)
Monocytes Relative: 3 %
NEUTROS ABS: 9.2 10*3/uL — AB (ref 1.7–7.7)
NEUTROS PCT: 71 %
Platelets: 246 10*3/uL (ref 150–400)
RBC: 4.07 MIL/uL (ref 3.87–5.11)
RDW: 13.5 % (ref 11.5–15.5)
WBC: 13.1 10*3/uL — ABNORMAL HIGH (ref 4.0–10.5)

## 2015-06-25 LAB — URINALYSIS, ROUTINE W REFLEX MICROSCOPIC
Bilirubin Urine: NEGATIVE
Glucose, UA: NEGATIVE mg/dL
Hgb urine dipstick: NEGATIVE
KETONES UR: NEGATIVE mg/dL
LEUKOCYTES UA: NEGATIVE
NITRITE: NEGATIVE
PROTEIN: NEGATIVE mg/dL
Specific Gravity, Urine: 1.02 (ref 1.005–1.030)
pH: 5.5 (ref 5.0–8.0)

## 2015-06-25 NOTE — MAU Provider Note (Signed)
History     CSN: [id number]  Arrival date and time: 06/25/15 0536   First Provider Initiated Contact with Patient 06/25/15 [phone]      Chief Complaint  Patient presents with  . Abdominal Pain   HPI Ms. Renee Coleman is a 35 y.o. G2P0100 at [redacted]w[redacted]d who presents to MAU today with complaint of RLQ abdominal pain since 2200 last night. The patient states pain has been intermittent over night. She notes pain is only when laying on her sides and she denies pain while laying on her back. She rates pain at 3/10 now. She denies vaginal bleeding, discharge, UTI symptoms, fever or N/V/D or constipation. She has not taken anything for pain. The patient has a history of a 22 week FD with a previous pregnancy and has a cerclage placed with this pregnancy since 05/24/15.   OB History    Gravida Para Term Preterm AB TAB SAB Ectopic Multiple Living   2 1 0 1 0  0  0 0      Past Medical History  Diagnosis Date  . Medical history non-contributory     Past Surgical History  Procedure Laterality Date  . No past surgeries    . Cervical cerclage N/A 11/28/2014    Procedure: CERCLAGE CERVICAL;  Surgeon: Marylynn Pearson, MD;  Location: [facility];  Service: Gynecology;  Laterality: N/A;  . Cervical cerclage N/A 05/24/2015    Procedure: CERCLAGE CERVICAL;  Surgeon: Marylynn Pearson, MD;  Location: [facility];  Service: Gynecology;  Laterality: N/A;    Family History  Problem Relation Age of Onset  . Miscarriages / Stillbirths Maternal Aunt   . Cancer Maternal Grandmother   . Stroke Paternal Grandmother     Social History  Substance Use Topics  . Smoking status: Never Smoker   . Smokeless tobacco: Never Used  . Alcohol Use: 0.6 oz/week    1 Glasses of wine per week     Comment: 1-2 drinks per day before preg    Allergies: No Known Allergies  Prescriptions prior to admission  Medication Sig Dispense Refill Last Dose  . Prenatal Vit-Fe Fumarate-FA (PRENATAL MULTIVITAMIN) TABS tablet Take 1 tablet by  mouth daily at 12 noon.   06/24/2015 at Unknown time  . azithromycin (ZITHROMAX) 250 MG tablet 2 po x 1day then 2 po qd x 4d 6 each 0   . Docosahexaenoic Acid (DHA PO) Take 1 capsule by mouth daily.     . Probiotic Product (PROBIOTIC PO) Take 1 capsule by mouth daily.       Review of Systems  Constitutional: Negative for fever and malaise/fatigue.  Gastrointestinal: Positive for abdominal pain. Negative for nausea, vomiting, diarrhea and constipation.  Genitourinary: Negative for dysuria, urgency and frequency.       Neg - vaginal bleeding, discharge   Physical Exam   Blood pressure 122/68, pulse 78, temperature 97.8 F (36.6 C), temperature source Oral, resp. rate 20, height 5\' 1"  (1.549 m), weight 119 lb 4 oz (54.091 kg), last menstrual period 07/13/2014, unknown if currently breastfeeding.  Physical Exam  Nursing note and vitals reviewed. Constitutional: She is oriented to person, place, and time. She appears well-developed and well-nourished. No distress.  HENT:  Head: Normocephalic and atraumatic.  Cardiovascular: Normal rate.   Respiratory: Effort normal.  GI: Soft. She exhibits no distension and no mass. There is tenderness (mild tenderness to palpation fo the RLQ). There is no rebound and no guarding.  Genitourinary:  Cerclage palpable and feels intact  Neurological:  She is alert and oriented to person, place, and time.  Skin: Skin is warm and dry. No erythema.  Psychiatric: She has a normal mood and affect.  Dilation: Closed Effacement (%): Thick Exam by:: Flonnie Overman PA  Results for orders placed or performed during the hospital encounter of 06/25/15 (from the past 24 hour(s))  Urinalysis, Routine w reflex microscopic (not at [facility])     Status: None   Collection Time: 06/25/15  5:56 AM  Result Value Ref Range   Color, Urine YELLOW YELLOW   APPearance CLEAR CLEAR   Specific Gravity, Urine 1.020 1.005 - 1.030   pH 5.5 5.0 - 8.0   Glucose, UA NEGATIVE NEGATIVE mg/dL    Hgb urine dipstick NEGATIVE NEGATIVE   Bilirubin Urine NEGATIVE NEGATIVE   Ketones, ur NEGATIVE NEGATIVE mg/dL   Protein, ur NEGATIVE NEGATIVE mg/dL   Nitrite NEGATIVE NEGATIVE   Leukocytes, UA NEGATIVE NEGATIVE  CBC with Differential/Platelet     Status: Abnormal   Collection Time: 06/25/15  7:04 AM  Result Value Ref Range   WBC 13.1 (H) 4.0 - 10.5 K/uL   RBC 4.07 3.87 - 5.11 MIL/uL   Hemoglobin 12.1 12.0 - 15.0 g/dL   HCT 34.0 (L) 36.0 - 46.0 %   MCV 83.5 78.0 - 100.0 fL   MCH 29.7 26.0 - 34.0 pg   MCHC 35.6 30.0 - 36.0 g/dL   RDW 13.5 11.5 - 15.5 %   Platelets 246 150 - 400 K/uL   Neutrophils Relative % 71 %   Neutro Abs 9.2 (H) 1.7 - 7.7 K/uL   Lymphocytes Relative 22 %   Lymphs Abs 2.9 0.7 - 4.0 K/uL   Monocytes Relative 3 %   Monocytes Absolute 0.5 0.1 - 1.0 K/uL   Eosinophils Relative 4 %   Eosinophils Absolute 0.5 0.0 - 0.7 K/uL   Basophils Relative 0 %   Basophils Absolute 0.0 0.0 - 0.1 K/uL    MAU Course  Procedures None  MDM FHR - 140 bpm CBC, UA today Discussed patient with Dr. Gaetano Net. He recommends discharge with reassurance at this time.  Assessment and Plan  A: SIUP at [redacted]w[redacted]d Round ligament pain H/O incompetent cervix, cerclage in place   P: Discharge home Tylenol PRN for pain Second trimester precautions discussed Discussed abdominal binder use for round ligament pain Patient advised to follow-up with [facility] as scheduled for routine prenatal care Patient may return to MAU as needed or if her condition were to change or worsen   Luvenia Redden, PA-C  06/25/2015, 7:44 AM

## 2015-06-25 NOTE — MAU Note (Signed)
PT SAYS SHE HAS PAIN ON RIGHT  SIDE - STARTED  AT 10PM-    AND  HAS FULLNESS-    STARTED - 0300.       SHE CALLED  NURSE ABOUT  PAIN ON SIDE - TOLD  TO CALL OFFICE  IN MORN- BUT WHEN SHE FELT  FULLNESS  - SHE CAME  TO MAU.

## 2015-06-25 NOTE — Discharge Instructions (Signed)

## 2015-07-25 ENCOUNTER — Encounter (HOSPITAL_COMMUNITY): Payer: Self-pay | Admitting: *Deleted

## 2015-11-04 ENCOUNTER — Inpatient Hospital Stay (HOSPITAL_COMMUNITY)
Admission: AD | Admit: 2015-11-04 | Discharge: 2015-11-06 | DRG: 775 | Disposition: A | Discharge status: Home / Self Care | Payer: BC Managed Care – PPO | Source: Ambulatory Visit | Attending: Obstetrics and Gynecology | Admitting: Obstetrics and Gynecology

## 2015-11-04 ENCOUNTER — Inpatient Hospital Stay (HOSPITAL_COMMUNITY): Payer: BC Managed Care – PPO | Admitting: Anesthesiology

## 2015-11-04 ENCOUNTER — Encounter (HOSPITAL_COMMUNITY): Payer: Self-pay | Admitting: *Deleted

## 2015-11-04 DIAGNOSIS — O3433 Maternal care for cervical incompetence, third trimester: Principal | ICD-10-CM | POA: Diagnosis present

## 2015-11-04 DIAGNOSIS — O326XX0 Maternal care for compound presentation, not applicable or unspecified: Secondary | ICD-10-CM | POA: Diagnosis present

## 2015-11-04 DIAGNOSIS — O163 Unspecified maternal hypertension, third trimester: Secondary | ICD-10-CM

## 2015-11-04 DIAGNOSIS — Z3483 Encounter for supervision of other normal pregnancy, third trimester: Secondary | ICD-10-CM | POA: Diagnosis present

## 2015-11-04 DIAGNOSIS — Z3A37 37 weeks gestation of pregnancy: Secondary | ICD-10-CM | POA: Diagnosis not present

## 2015-11-04 DIAGNOSIS — Z823 Family history of stroke: Secondary | ICD-10-CM | POA: Diagnosis not present

## 2015-11-04 DIAGNOSIS — IMO0001 Reserved for inherently not codable concepts without codable children: Secondary | ICD-10-CM

## 2015-11-04 HISTORY — DX: Benign neoplasm of connective and other soft tissue, unspecified: D21.9

## 2015-11-04 LAB — COMPREHENSIVE METABOLIC PANEL
ALT: 15 U/L (ref 14–54)
AST: 26 U/L (ref 15–41)
Albumin: 3.1 g/dL — ABNORMAL LOW (ref 3.5–5.0)
Alkaline Phosphatase: 141 U/L — ABNORMAL HIGH (ref 38–126)
Anion gap: 8 (ref 5–15)
BILIRUBIN TOTAL: 0.5 mg/dL (ref 0.3–1.2)
BUN: 9 mg/dL (ref 6–20)
CO2: 21 mmol/L — ABNORMAL LOW (ref 22–32)
CREATININE: 0.71 mg/dL (ref 0.44–1.00)
Calcium: 9.2 mg/dL (ref 8.9–10.3)
Chloride: 107 mmol/L (ref 101–111)
GFR calc Af Amer: >60 mL/min (ref >60)
Glucose, Bld: 85 mg/dL (ref 65–99)
POTASSIUM: 3.7 mmol/L (ref 3.5–5.1)
Sodium: 136 mmol/L (ref 135–145)
TOTAL PROTEIN: 6.9 g/dL (ref 6.5–8.1)

## 2015-11-04 LAB — CBC
HEMATOCRIT: 37.2 % (ref 36.0–46.0)
Hemoglobin: 13.2 g/dL (ref 12.0–15.0)
MCH: 29.5 pg (ref 26.0–34.0)
MCHC: 35.5 g/dL (ref 30.0–36.0)
MCV: 83 fL (ref 78.0–100.0)
Platelets: 214 10*3/uL (ref 150–400)
RBC: 4.48 MIL/uL (ref 3.87–5.11)
RDW: 14.2 % (ref 11.5–15.5)
WBC: 12.6 10*3/uL — AB (ref 4.0–10.5)

## 2015-11-04 LAB — PROTEIN / CREATININE RATIO, URINE
CREATININE, URINE: 26 mg/dL
Total Protein, Urine: <6 mg/dL

## 2015-11-04 LAB — GROUP B STREP BY PCR: Group B strep by PCR: NEGATIVE

## 2015-11-04 LAB — TYPE AND SCREEN
ABO/RH(D): O POS
Antibody Screen: NEGATIVE

## 2015-11-04 LAB — OB RESULTS CONSOLE GBS
GBS: NEGATIVE
STREP GROUP B AG: NEGATIVE

## 2015-11-04 LAB — RPR: RPR: NONREACTIVE

## 2015-11-04 MED ORDER — BENZOCAINE-MENTHOL 20-0.5 % EX AERO
1.0000 "application " | INHALATION_SPRAY | CUTANEOUS | Status: DC | PRN
  Administered 2015-11-04: 1 via TOPICAL
  Filled 2015-11-04: qty 56

## 2015-11-04 MED ORDER — EPHEDRINE 5 MG/ML INJ
10.0000 mg | INTRAVENOUS | Status: DC | PRN
  Filled 2015-11-04: qty 4

## 2015-11-04 MED ORDER — DIPHENHYDRAMINE HCL 50 MG/ML IJ SOLN: 12.5000 mg | INTRAMUSCULAR | Status: DC | PRN

## 2015-11-04 MED ORDER — LACTATED RINGERS IV SOLN
INTRAVENOUS | Status: DC
  Administered 2015-11-04: 125 mL/h via INTRAVENOUS
  Administered 2015-11-04: 04:00:00 via INTRAVENOUS
  Administered 2015-11-04: 125 mL/h via INTRAVENOUS

## 2015-11-04 MED ORDER — PHENYLEPHRINE 40 MCG/ML (10ML) SYRINGE FOR IV PUSH (FOR BLOOD PRESSURE SUPPORT): 80.0000 ug | PREFILLED_SYRINGE | INTRAVENOUS | Status: DC | PRN

## 2015-11-04 MED ORDER — ZOLPIDEM TARTRATE 5 MG PO TABS: 5.0000 mg | ORAL_TABLET | Freq: Every evening | ORAL | Status: DC | PRN

## 2015-11-04 MED ORDER — OXYTOCIN 40 UNITS IN LACTATED RINGERS INFUSION - SIMPLE MED
2.5000 [IU]/h | INTRAVENOUS | Status: DC
  Filled 2015-11-04: qty 1000

## 2015-11-04 MED ORDER — PHENYLEPHRINE 40 MCG/ML (10ML) SYRINGE FOR IV PUSH (FOR BLOOD PRESSURE SUPPORT)
80.0000 ug | PREFILLED_SYRINGE | INTRAVENOUS | Status: DC | PRN
  Filled 2015-11-04: qty 10
  Filled 2015-11-04: qty 5

## 2015-11-04 MED ORDER — SOD CITRATE-CITRIC ACID 500-334 MG/5ML PO SOLN: 30.0000 mL | ORAL | Status: DC | PRN

## 2015-11-04 MED ORDER — ACETAMINOPHEN 325 MG PO TABS: 650.0000 mg | ORAL_TABLET | ORAL | Status: DC | PRN

## 2015-11-04 MED ORDER — ONDANSETRON HCL 4 MG/2ML IJ SOLN: 4.0000 mg | Freq: Four times a day (QID) | INTRAMUSCULAR | Status: DC | PRN

## 2015-11-04 MED ORDER — ONDANSETRON HCL 4 MG PO TABS: 4.0000 mg | ORAL_TABLET | ORAL | Status: DC | PRN

## 2015-11-04 MED ORDER — OXYCODONE-ACETAMINOPHEN 5-325 MG PO TABS: 1.0000 | ORAL_TABLET | ORAL | Status: DC | PRN

## 2015-11-04 MED ORDER — OXYTOCIN 40 UNITS IN LACTATED RINGERS INFUSION - SIMPLE MED
1.0000 m[IU]/min | INTRAVENOUS | Status: DC
  Administered 2015-11-04: 2 m[IU]/min via INTRAVENOUS

## 2015-11-04 MED ORDER — SENNOSIDES-DOCUSATE SODIUM 8.6-50 MG PO TABS
2.0000 | ORAL_TABLET | ORAL | Status: DC
  Administered 2015-11-04 – 2015-11-05 (×2): 2 via ORAL
  Filled 2015-11-04 (×2): qty 2

## 2015-11-04 MED ORDER — LACTATED RINGERS IV SOLN: 500.0000 mL | Freq: Once | INTRAVENOUS | Status: DC

## 2015-11-04 MED ORDER — PHENYLEPHRINE 40 MCG/ML (10ML) SYRINGE FOR IV PUSH (FOR BLOOD PRESSURE SUPPORT)
80.0000 ug | PREFILLED_SYRINGE | INTRAVENOUS | Status: DC | PRN
  Filled 2015-11-04: qty 5

## 2015-11-04 MED ORDER — TERBUTALINE SULFATE 1 MG/ML IJ SOLN
0.2500 mg | Freq: Once | INTRAMUSCULAR | Status: DC | PRN
  Filled 2015-11-04: qty 1

## 2015-11-04 MED ORDER — LACTATED RINGERS IV SOLN
500.0000 mL | INTRAVENOUS | Status: DC | PRN
  Administered 2015-11-04 (×2): 500 mL via INTRAVENOUS

## 2015-11-04 MED ORDER — ONDANSETRON HCL 4 MG/2ML IJ SOLN: 4.0000 mg | INTRAMUSCULAR | Status: DC | PRN

## 2015-11-04 MED ORDER — PENICILLIN G POTASSIUM 5000000 UNITS IJ SOLR
2.5000 10*6.[IU] | INTRAVENOUS | Status: DC
  Filled 2015-11-04: qty 2.5

## 2015-11-04 MED ORDER — OXYCODONE-ACETAMINOPHEN 5-325 MG PO TABS: 2.0000 | ORAL_TABLET | ORAL | Status: DC | PRN

## 2015-11-04 MED ORDER — PRENATAL MULTIVITAMIN CH
1.0000 | ORAL_TABLET | Freq: Every day | ORAL | Status: DC
  Administered 2015-11-05: 1 via ORAL
  Filled 2015-11-04: qty 1

## 2015-11-04 MED ORDER — LIDOCAINE HCL (PF) 1 % IJ SOLN
30.0000 mL | INTRAMUSCULAR | Status: DC | PRN
  Filled 2015-11-04: qty 30

## 2015-11-04 MED ORDER — OXYTOCIN BOLUS FROM INFUSION: 500.0000 mL | Freq: Once | INTRAVENOUS | Status: DC

## 2015-11-04 MED ORDER — COCONUT OIL OIL: 1.0000 "application " | TOPICAL_OIL | Status: DC | PRN

## 2015-11-04 MED ORDER — IBUPROFEN 600 MG PO TABS
600.0000 mg | ORAL_TABLET | Freq: Four times a day (QID) | ORAL | Status: DC
  Administered 2015-11-04 – 2015-11-06 (×6): 600 mg via ORAL
  Filled 2015-11-04 (×6): qty 1

## 2015-11-04 MED ORDER — FENTANYL 2.5 MCG/ML BUPIVACAINE 1/10 % EPIDURAL INFUSION (WH - ANES): 14.0000 mL/h | INTRAMUSCULAR | Status: DC | PRN

## 2015-11-04 MED ORDER — EPHEDRINE 5 MG/ML INJ: 10.0000 mg | INTRAVENOUS | Status: DC | PRN

## 2015-11-04 MED ORDER — PENICILLIN G POTASSIUM 5000000 UNITS IJ SOLR
5.0000 10*6.[IU] | Freq: Once | INTRAMUSCULAR | Status: DC
  Filled 2015-11-04: qty 5

## 2015-11-04 MED ORDER — FENTANYL 2.5 MCG/ML BUPIVACAINE 1/10 % EPIDURAL INFUSION (WH - ANES)
14.0000 mL/h | INTRAMUSCULAR | Status: DC | PRN
  Administered 2015-11-04 (×2): 14 mL/h via EPIDURAL
  Filled 2015-11-04 (×2): qty 125

## 2015-11-04 MED ORDER — SIMETHICONE 80 MG PO CHEW: 80.0000 mg | CHEWABLE_TABLET | ORAL | Status: DC | PRN

## 2015-11-04 MED ORDER — OXYCODONE-ACETAMINOPHEN 5-325 MG PO TABS
2.0000 | ORAL_TABLET | ORAL | Status: DC | PRN
Start: 2015-11-04 — End: 2015-11-06

## 2015-11-04 MED ORDER — DIBUCAINE 1 % RE OINT: 1.0000 "application " | TOPICAL_OINTMENT | RECTAL | Status: DC | PRN

## 2015-11-04 MED ORDER — DIPHENHYDRAMINE HCL 25 MG PO CAPS: 25.0000 mg | ORAL_CAPSULE | Freq: Four times a day (QID) | ORAL | Status: DC | PRN

## 2015-11-04 MED ORDER — WITCH HAZEL-GLYCERIN EX PADS: 1.0000 "application " | MEDICATED_PAD | CUTANEOUS | Status: DC | PRN

## 2015-11-04 MED ORDER — LIDOCAINE HCL (PF) 1 % IJ SOLN
INTRAMUSCULAR | Status: DC | PRN
  Administered 2015-11-04 (×2): 4 mL via EPIDURAL

## 2015-11-04 NOTE — Progress Notes (Signed)
Delivry of live viable female by Dr Alfred Levins APGAS 9,9

## 2015-11-04 NOTE — MAU Provider Note (Signed)
Chief Complaint:  No chief complaint on file.   First Provider Initiated Contact with Patient 11/04/15 0204      HPI: Renee Coleman is a 35 y.o. G2P0100 at 58w5dwho presents to maternity admissions reporting cramping/contractions with onset 3-4 hours ago, worsening over time. The pain is unchanged by position change or increased PO fluids. It is intermittent, in her lower abdomen and low back, and worsening since onset. Her cerclage was removed last week, placed r/t history of 22 week vaginal delivery with previous pregnancy.  She denies h/a, epigastric pain, or visual disturbances. She has no prior hx of HTN. She reports good fetal movement, denies LOF, vaginal bleeding, vaginal itching/burning, urinary symptoms, dizziness, n/v, or fever/chills.    HPI  Past Medical History: Past Medical History:  Diagnosis Date  . Fibroid    uterine  . Medical history non-contributory     Past obstetric history: OB History  Gravida Para Term Preterm AB Living  2 1 0 1 0 0  SAB TAB Ectopic Multiple Live Births  0     0 1    # Outcome Date GA Lbr Len/2nd Weight Sex Delivery Anes PTL Lv  2 Current           1 Preterm 12/18/14 [redacted]w[redacted]d 168:57 / 00:57 1 lb 0.6 oz (0.471 kg) M Vag-Spont EPI  DEC      Past Surgical History: Past Surgical History:  Procedure Laterality Date  . CERVICAL CERCLAGE N/A 11/28/2014   Procedure: CERCLAGE CERVICAL;  Surgeon: Marylynn Pearson, MD;  Location: [facility];  Service: Gynecology;  Laterality: N/A;  . CERVICAL CERCLAGE N/A 05/24/2015   Procedure: CERCLAGE CERVICAL;  Surgeon: Marylynn Pearson, MD;  Location: [facility];  Service: Gynecology;  Laterality: N/A;  . NO PAST SURGERIES      Family History: Family History  Problem Relation Age of Onset  . Miscarriages / Stillbirths Maternal Aunt   . Cancer Maternal Grandmother   . Stroke Paternal Grandmother     Social History: Social History  Substance Use Topics  . Smoking status: Never Smoker  . Smokeless tobacco: Never  Used  . Alcohol use 0.6 oz/week    1 Glasses of wine per week     Comment: 1-2 drinks per day before preg    Allergies: No Known Allergies  Meds:  Prescriptions Prior to Admission  Medication Sig Dispense Refill Last Dose  . Prenatal Vit-Fe Fumarate-FA (PRENATAL MULTIVITAMIN) TABS tablet Take 1 tablet by mouth daily at 12 noon.   06/24/2015 at Unknown time  . Probiotic Product (PROBIOTIC PO) Take 1 capsule by mouth daily.       ROS:  Review of Systems  Constitutional: Negative for chills, fatigue and fever.  Eyes: Negative for visual disturbance.  Respiratory: Negative for shortness of breath.   Cardiovascular: Negative for chest pain.  Gastrointestinal: Negative for abdominal pain, nausea and vomiting.  Genitourinary: Negative for difficulty urinating, dysuria, flank pain, pelvic pain, vaginal bleeding, vaginal discharge and vaginal pain.  Neurological: Negative for dizziness and headaches.  Psychiatric/Behavioral: Negative.      I have reviewed patient's Past Medical Hx, Surgical Hx, Family Hx, Social Hx, medications and allergies.   Physical Exam   Patient Vitals for the past 24 hrs:  BP Temp src Pulse Resp SpO2 Height Weight  11/04/15 0234 148/88 - 69 - - - -  11/04/15 0145 127/70 - - - - - -  11/04/15 0135 142/86 - - - - - -  11/04/15 0117 (!) 146/103 - - - - - -  11/04/15 0116 129/96 - - - - - -  11/04/15 0058 152/96 Oral 77 18 100 % 5\' 1"  (1.549 m) 137 lb (62.1 kg)   Constitutional: Well-developed, well-nourished female in no acute distress.  HEART: normal rate, heart sounds, regular rhythm RESP: normal effort, lung sounds clear and equal bilaterally GI: Abd soft, non-tender, gravid appropriate for gestational age.  MS: Extremities nontender, no edema, normal ROM Neurologic: Alert and oriented x 4.  GU: Neg CVAT.   Dilation: 1 Effacement (%): 90 Cervical Position: Middle Station: -1 Presentation: Vertex Exam by:: K. WeissRN  FHT:  Baseline 135, moderate  variability, accelerations present, no decelerations Contractions: q 3-4 mins   Labs: Results for orders placed or performed during the hospital encounter of 11/04/15 (from the past 24 hour(s))  Protein / creatinine ratio, urine     Status: None   Collection Time: 11/04/15  1:35 AM  Result Value Ref Range   Creatinine, Urine 26.00 mg/dL   Total Protein, Urine <6 mg/dL   Protein Creatinine Ratio        0.00 - 0.15 mg/mg[Cre]  CBC     Status: Abnormal   Collection Time: 11/04/15  1:52 AM  Result Value Ref Range   WBC 12.6 (H) 4.0 - 10.5 K/uL   RBC 4.48 3.87 - 5.11 MIL/uL   Hemoglobin 13.2 12.0 - 15.0 g/dL   HCT 37.2 36.0 - 46.0 %   MCV 83.0 78.0 - 100.0 fL   MCH 29.5 26.0 - 34.0 pg   MCHC 35.5 30.0 - 36.0 g/dL   RDW 14.2 11.5 - 15.5 %   Platelets 214 150 - 400 K/uL  Comprehensive metabolic panel     Status: Abnormal   Collection Time: 11/04/15  1:52 AM  Result Value Ref Range   Sodium 136 135 - 145 mmol/L   Potassium 3.7 3.5 - 5.1 mmol/L   Chloride 107 101 - 111 mmol/L   CO2 21 (L) 22 - 32 mmol/L   Glucose, Bld 85 65 - 99 mg/dL   BUN 9 6 - 20 mg/dL   Creatinine, Ser 0.71 0.44 - 1.00 mg/dL   Calcium 9.2 8.9 - 10.3 mg/dL   Total Protein 6.9 6.5 - 8.1 g/dL   Albumin 3.1 (L) 3.5 - 5.0 g/dL   AST 26 15 - 41 U/L   ALT 15 14 - 54 U/L   Alkaline Phosphatase 141 (H) 38 - 126 U/L   Total Bilirubin 0.5 0.3 - 1.2 mg/dL   GFR calc non Af Amer >60 >60 mL/min   GFR calc Af Amer >60 >60 mL/min   Anion gap 8 5 - 15   --/--/O POS (10/10 1706)  Imaging:  No results found.  MAU Course/MDM: I have ordered labs and reviewed results.  Preeclampsia labs wnl.  Cervical change from 0.5 to 1 cm and 70% to 90% noted by RN on exam. Consult Dr Julien Girt. Admit for active labor/HTN in pregnancy.  May have epidural when desired. Pt stable at time of transfer.  Assessment: 1. Active labor at term   2. Incompetent cervix in pregnancy, antepartum, third trimester   3. Hypertension during pregnancy in  third trimester, unspecified hypertension in pregnancy   4.  GBS unknown, swab collected in office, no results available  Plan: Admit to [place] GBS PCR collected per Dr Julien Girt, treat if positive Anticipate NSVD  Fatima Blank Certified Nurse-Midwife 11/04/2015 2:57 AM

## 2015-11-04 NOTE — Lactation Note (Signed)
This note was copied from a baby's chart. Lactation Consultation Note  Patient Name: [name] [id number] Date: 11/04/2015 Reason for consult: Initial assessment Baby at 5 hr of life. Mom thinks baby is latching well but would like lactation to take a look at the next feeding. She denies breast or nipple pain. Discussed baby behavior, feeding frequency, baby belly size, voids, wt loss, breast changes, and nipple care. She can manually express and has spoon in room. Given lactation handouts. Aware of OP services and support group.   Maternal Data Formula Feeding for Exclusion: No Has patient been taught Hand Expression?: Yes  Feeding Feeding Type: Breast Fed Length of feed: 20 min  LATCH Score/Interventions                      Lactation Tools Discussed/Used WIC Program: No   Consult Status Consult Status: Follow-up Date: 11/05/15 Follow-up type: In-patient    Denzil Hughes 11/04/2015, 10:09 PM

## 2015-11-04 NOTE — Progress Notes (Signed)
Patient ID: Renee Coleman, female   DOB: 04/04/1980, 35 y.o.   MRN: [id number] S: Comf w/cle  O:  Vitals  HR 90-100s BP 140s/90s  Gen: NAD SVE: 4/0.5/-1 FHT: cat 1 Toco: q 5 min  A/P: Pt is a G2P0100 here with labor, s/p cerclage removal Wednesday.  Labor: latent, 4/0.5/-1, likely transitioning. AROM for clear fluid. Continue pitocin.   GBS unknown, given no Rfs, not initiating PCN. PCR neg in MAU.   Lucillie Garfinkel MD

## 2015-11-04 NOTE — Anesthesia Preprocedure Evaluation (Signed)
Anesthesia Evaluation  Patient identified by MRN, date of birth, ID band Patient awake    Reviewed: Allergy & Precautions, NPO status , Patient's Chart, lab work & pertinent test results  Airway Mallampati: II  TM Distance: >3 FB Neck ROM: Full    Dental  (+) Teeth Intact, Dental Advisory Given   Pulmonary neg pulmonary ROS,    breath sounds clear to auscultation       Cardiovascular negative cardio ROS   Rhythm:Regular Rate:Normal     Neuro/Psych negative neurological ROS  negative psych ROS   GI/Hepatic negative GI ROS, Neg liver ROS,   Endo/Other  negative endocrine ROS  Renal/GU negative Renal ROS  negative genitourinary   Musculoskeletal negative musculoskeletal ROS (+)   Abdominal   Peds negative pediatric ROS (+)  Hematology negative hematology ROS (+)   Anesthesia Other Findings   Reproductive/Obstetrics (+) Pregnancy                             Lab Results  Component Value Date   WBC 12.6 (H) 11/04/2015   HGB 13.2 11/04/2015   HCT 37.2 11/04/2015   MCV 83.0 11/04/2015   PLT 214 11/04/2015   No results found for: INR, PROTIME  Anesthesia Physical Anesthesia Plan  ASA: II  Anesthesia Plan: Epidural   Post-op Pain Management:    Induction:   Airway Management Planned:   Additional Equipment:   Intra-op Plan:   Post-operative Plan:   Informed Consent: I have reviewed the patients History and Physical, chart, labs and discussed the procedure including the risks, benefits and alternatives for the proposed anesthesia with the patient or authorized representative who has indicated his/her understanding and acceptance.     Plan Discussed with:   Anesthesia Plan Comments:         Anesthesia Quick Evaluation

## 2015-11-04 NOTE — Anesthesia Procedure Notes (Signed)
Epidural Patient location during procedure: OB Start time: 11/04/2015 8:32 AM End time: 11/04/2015 8:39 AM  Staffing Anesthesiologist: Suella Broad D Performed: anesthesiologist   Preanesthetic Checklist Completed: patient identified, site marked, surgical consent, pre-op evaluation, timeout performed, IV checked, risks and benefits discussed and monitors and equipment checked  Epidural Patient position: sitting Prep: ChloraPrep Patient monitoring: heart rate, continuous pulse ox and blood pressure Approach: midline Location: L3-L4 Injection technique: LOR saline  Needle:  Needle type: Tuohy  Needle gauge: 17 G Needle length: 9 cm Catheter type: closed end flexible Catheter size: 20 Guage Test dose: negative and 1.5% lidocaine  Assessment Events: blood not aspirated, injection not painful, no injection resistance and no paresthesia  Additional Notes LOR @ 4.5  Patient identified. Risks/Benefits/Options discussed with patient including but not limited to bleeding, infection, nerve damage, paralysis, failed block, incomplete pain control, headache, blood pressure changes, nausea, vomiting, reactions to medications, itching and postpartum back pain. Confirmed with bedside nurse the patient's most recent platelet count. Confirmed with patient that they are not currently taking any anticoagulation, have any bleeding history or any family history of bleeding disorders. Patient expressed understanding and wished to proceed. All questions were answered. Sterile technique was used throughout the entire procedure. Please see nursing notes for vital signs. Test dose was given through epidural catheter and negative prior to continuing to dose epidural or start infusion. Warning signs of high block given to the patient including shortness of breath, tingling/numbness in hands, complete motor block, or any concerning symptoms with instructions to call for help. Patient was given instructions on  fall risk and not to get out of bed. All questions and concerns addressed with instructions to call with any issues or inadequate analgesia.    Reason for block:procedure for pain

## 2015-11-04 NOTE — H&P (Signed)
Renee Coleman is a 35 y.o. female presenting for early labor- s/p cerclage for h/o PTB @ 96 wga, removed 5 days ago. On 17OHP this pregnancy. No SROM. +FM. GBS unknown.   OB History    Gravida Para Term Preterm AB Living   2 1 0 1 0 0   SAB TAB Ectopic Multiple Live Births   0     0 1     Past Medical History:  Diagnosis Date  . Fibroid    uterine  . Medical history non-contributory    Past Surgical History:  Procedure Laterality Date  . CERVICAL CERCLAGE N/A 11/28/2014   Procedure: CERCLAGE CERVICAL;  Surgeon: Marylynn Pearson, MD;  Location: [facility];  Service: Gynecology;  Laterality: N/A;  . CERVICAL CERCLAGE N/A 05/24/2015   Procedure: CERCLAGE CERVICAL;  Surgeon: Marylynn Pearson, MD;  Location: [facility];  Service: Gynecology;  Laterality: N/A;  . NO PAST SURGERIES     Family History: family history includes Cancer in her maternal grandmother; Miscarriages / Stillbirths in her maternal aunt; Stroke in her paternal grandmother. Social History:  reports that she has never smoked. She has never used smokeless tobacco. She reports that she drinks about 0.6 oz of alcohol per week . She reports that she does not use drugs.     Maternal Diabetes: No Genetic Screening: Normal Maternal Ultrasounds/Referrals: Normal Fetal Ultrasounds or other Referrals:  None Maternal Substance Abuse:  No Significant Maternal Medications:  None Significant Maternal Lab Results:  None Other Comments:  None  Review of Systems  Eyes: Negative for blurred vision and double vision.  Cardiovascular: Negative for chest pain.  Gastrointestinal: Negative for vomiting.  Neurological: Negative for dizziness.   Maternal Medical History:  Reason for admission: Contractions.   Contractions: Onset was 3-5 hours ago.   Frequency: regular.    Fetal activity: Perceived fetal activity is normal.      Dilation: 1 Effacement (%): 90 Station: -1 Exam by:: Mammie Russian, RN Blood pressure 126/82, pulse 66,  temperature 98.6 F (37 C), temperature source Oral, resp. rate 16, height 5\' 1"  (1.549 m), weight 62.1 kg (137 lb), last menstrual period 07/13/2014, SpO2 100 %, unknown if currently breastfeeding. Maternal Exam:  Uterine Assessment: Contraction strength is firm.  Contraction frequency is irregular.   Abdomen: Patient reports no abdominal tenderness. Fetal presentation: vertex  Introitus: Normal vulva. Normal vagina.  Ferning test: not done.  Nitrazine test: not done. Amniotic fluid character: not assessed.  Pelvis: adequate for delivery.   Cervix: Cervix evaluated by digital exam.     Physical Exam  Gen: NAD, painfully contracting q 2-7 min Cv: reg rate in btw ctxn Pulm: NWOB Abd: gravid SVE: 1/9/-2  Prenatal labs: ABO, Rh: --/--/O POS (08/28 0152) Antibody: NEG (08/28 0152) Rubella: Immune (02/22 0000) RPR: Nonreactive (02/22 0000)  HBsAg: Negative (02/22 0000)  HIV: Non-reactive (02/22 0000)  GBS: Negative (08/28 0000)   Assessment/Plan: 35 yo G2P0100 @ 37.5 wga p/w ctxn, in early latent labor with cervical change from ~closed to 1cm/90%.  H/o PTB @ 22gwa, has been on weekly IM17-OHP this pregnancy and had cerclage placed. Cerclage was removed Wednesday.   # Labor: admitted after making cervical change. + painful ctxn, requesting cle now. Given no change over last 4 hours, initiate pitocin. Significant cervical scar from cerclage. Will attempt gentle digital stretching/adhesiologysis once CLE in place. AROM prn  # MWB: a few isolated mild range Bps in 140s/90s. PIH labs WNL. UPC 0.23. CTM Bps closely -  h/o fibroids - h/o PTB @ 46 wga - otherwise no sig med/surg issues  # FWB: cat 1 tracing, vertex  # GBS unknown. Given no Rf's, will not initiate PCN.   # Pp: having a girl, "zoey". Plans to Bf. Ri.   Tyson Dense 11/04/2015, 8:48 AM

## 2015-11-04 NOTE — Anesthesia Pain Management Evaluation Note (Signed)
  CRNA Pain Management Visit Note  Patient: Renee Coleman, 35 y.o., female  "Hello I am a member of the anesthesia team at [facility]. We have an anesthesia team available at all times to provide care throughout the hospital, including epidural management and anesthesia for C-section. I don't know your plan for the delivery whether it a natural birth, water birth, IV sedation, nitrous supplementation, doula or epidural, but we want to meet your pain goals."   1.Was your pain managed to your expectations on prior hospitalizations?   No prior hospitalizations  2.What is your expectation for pain management during this hospitalization?     Epidural  3.How can we help you reach that goal? epidural  Record the patient's initial score and the patient's pain goal.   Pain: 0  Pain Goal: 4 [facility] wants you to be able to say your pain was always managed very well.  Renee Coleman 11/04/2015

## 2015-11-04 NOTE — MAU Note (Signed)
Pt reports contractions, denies bleeding or ROM.  

## 2015-11-05 LAB — CBC
HCT: 33.6 % — ABNORMAL LOW (ref 36.0–46.0)
Hemoglobin: 11.7 g/dL — ABNORMAL LOW (ref 12.0–15.0)
MCH: 29.3 pg (ref 26.0–34.0)
MCHC: 34.8 g/dL (ref 30.0–36.0)
MCV: 84.2 fL (ref 78.0–100.0)
PLATELETS: 193 10*3/uL (ref 150–400)
RBC: 3.99 MIL/uL (ref 3.87–5.11)
RDW: 14.3 % (ref 11.5–15.5)
WBC: 19.3 10*3/uL — ABNORMAL HIGH (ref 4.0–10.5)

## 2015-11-05 NOTE — Anesthesia Postprocedure Evaluation (Signed)
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  Procedure(s) Performed: * No procedures listed *  Patient location during evaluation: Mother Baby Anesthesia Type: Epidural Level of consciousness: awake, awake and alert, oriented and patient cooperative Pain management: pain level controlled Vital Signs Assessment: post-procedure vital signs reviewed and stable Respiratory status: spontaneous breathing, nonlabored ventilation and respiratory function stable Cardiovascular status: stable Postop Assessment: no headache, no backache, no signs of nausea or vomiting and patient able to bend at knees Anesthetic complications: no     Last Vitals:  Vitals:   11/04/15 2317 11/05/15 0618  BP: (!) 147/81 109/64  Pulse: (!) 57 (!) 52  Resp: 20 20  Temp: 36.4 C 36.4 C    Last Pain:  Vitals:   11/05/15 0618  TempSrc: Oral  PainSc:    Pain Goal: Patients Stated Pain Goal: 5 (11/04/15 0343)               Odis Turck L

## 2015-11-05 NOTE — Lactation Note (Signed)
This note was copied from a baby's chart. Lactation Consultation Note Baby 5;2 lbs. Bf well. Need encouraged, has a lot of questions,discussed positioning, props, I&O, STS. Mom is BF and pumping w/DEBP for  stimulation. Encouraged to assess breast before and after BF. Mom is to supplement w/colostrum.hand expressed pendulum breast. Discussed baby under 6lbs need supplemented. Mom in agreement.  Patient Name: Renee Coleman [id number] Date: 11/05/2015 Reason for consult: Follow-up assessment;Infant < 6lbs   Maternal Data    Feeding Feeding Type: Breast Fed Length of feed: 15 min  LATCH Score/Interventions Latch: Grasps breast easily, tongue down, lips flanged, rhythmical sucking. Intervention(s): Breast massage;Assist with latch;Breast compression  Audible Swallowing: A few with stimulation Intervention(s): Skin to skin;Hand expression Intervention(s): Skin to skin;Hand expression;Alternate breast massage  Type of Nipple: Everted at rest and after stimulation  Comfort (Breast/Nipple): Soft / non-tender     Hold (Positioning): Assistance needed to correctly position infant at breast and maintain latch. Intervention(s): Breastfeeding basics reviewed;Support Pillows;Position options;Skin to skin  LATCH Score: 8  Lactation Tools Discussed/Used Tools: Pump Breast pump type: Double-Electric Breast Pump Pump Review: Setup, frequency, and cleaning;Milk Storage Initiated by:: Allayne Stack RN IBCLC Date initiated:: 11/05/15   Consult Status Consult Status: Follow-up Date: 11/06/15 Follow-up type: In-patient    Theodoro Kalata 11/05/2015, 5:12 AM

## 2015-11-05 NOTE — Progress Notes (Signed)
Post Partum Day 1 Subjective: no complaints, up ad lib, voiding and tolerating PO  Objective: Blood pressure 109/64, pulse (!) 52, temperature 97.5 F (36.4 C), temperature source Oral, resp. rate 20, height 5\' 1"  (1.549 m), weight 137 lb (62.1 kg), last menstrual period 07/13/2014, SpO2 99 %, unknown if currently breastfeeding.  Physical Exam:  General: alert and cooperative Lochia: appropriate Uterine Fundus: firm Incision: healing well, small hemorrhoid DVT Evaluation: No evidence of DVT seen on physical exam. Negative Homan's sign. No cords or calf tenderness. No significant calf/ankle edema.   Recent Labs  11/04/15 0152 11/05/15 0521  HGB 13.2 11.7*  HCT 37.2 33.6*    Assessment/Plan: Plan for discharge tomorrow   LOS: 1 day   Raylan Hanton G 11/05/2015, 7:58 AM

## 2015-11-05 NOTE — Lactation Note (Addendum)
This note was copied from a baby's chart. Lactation Consultation Note  Patient Name: Renee Coleman [id number] Date: 11/05/2015 Reason for consult: Follow-up assessment (baby still rooting )  Baby is 19 hours old and had just recently been to the breast 15  mins each breast  Per mom and dad. When [name] walked in baby still rooting. LC assisted with re- latch  On the left breast for 10 mins with swallows. Baby latched with. LC noted when the baby  Is active at the breast for short interval with swallows and then sits with the nipple in her mouth Properly latched and becomes non - nutritive, sluggish even with stimulation, had mom release latch  The baby starts rooting. After a few times of baby doing the sam e thing , LC wrapped baby up and due to  Mom not sleeping since 1 am today. LC recommended either dad or auntie holding baby. Mom is very tired  And LC feels moms milk isn't let- down after several latches. LC recommended to mom to take a nap for an hour  Or 2.  And then re-latch. Baby had eat'en with swallows.  Per mom has hand expressed earlier today and fed 5 ml and then 1 ml.     Maternal Data Has patient been taught Hand Expression?: Yes  Feeding Feeding Type: Breast Fed Length of feed: 5 min (few swallows, and non - nutritve feeding pattern )  LATCH Score/Interventions Latch: Grasps breast easily, tongue down, lips flanged, rhythmical sucking. (left breast ) Intervention(s): Adjust position;Assist with latch;Breast compression;Breast massage  Audible Swallowing: A few with stimulation  Type of Nipple: Everted at rest and after stimulation  Comfort (Breast/Nipple): Soft / non-tender     Hold (Positioning): Assistance needed to correctly position infant at breast and maintain latch. Intervention(s): Breastfeeding basics reviewed;Support Pillows;Position options;Skin to skin  LATCH Score: 8  Lactation Tools Discussed/Used Tools: Pump (alaready set up and mom had  expressing ) Breast pump type: Double-Electric Breast Pump   Consult Status Consult Status: Follow-up Date: 11/06/15 Follow-up type: In-patient    Myer Haff 11/05/2015, 3:06 PM

## 2015-11-06 MED ORDER — IBUPROFEN 600 MG PO TABS: 600.0000 mg | ORAL_TABLET | Freq: Four times a day (QID) | ORAL | 1 refills | Status: DC

## 2015-11-06 NOTE — Lactation Note (Signed)
This note was copied from a baby's chart. Lactation Consultation Note  Patient Name: Renee Coleman [id number] Date: 11/06/2015 Reason for consult: Follow-up assessment;Other (Comment);Infant weight loss (4% weight loss, 5-13 oz, )  Baby is 92 hours old and has been consistent at the breast and also supplemented with EBM with syringe and  Finger feeding. LC reviewed basics - mom hand expresses well, breast are getting fuller. @ consult it had been 3 hours so LC checked  Diaper dry at that time. Baby placed skin to skin , and mom latched baby, LC just assisted with depth at the breast. Multiply swallows at  The breast, and increased with breast compressions. Baby released after about 10 mins , wet diaper changed and baby re-latched shortly after wards.  Better depth achieved, multiply swallows noted, increased with compressions , baby fed for about 5 mins more and fell asleep. Mom instructed to release  Baby from the breast and nipple well rounded. Baby relaxed and asleep. LC stressed the importance of watching baby for hanging out - non - nutritive feeding patterns.  And if she is doing that to release baby, wake up , and try re-latching , if still sluggish, try appetizer of EBM, re - latch. If still not wanting to latch wait awhile and try again.  LC also stressed the importance of not going over 3 hours to feed baby around the clock until gaining well.  Sore nipple and engorgement prevention and tx reviewed. Per mom nipples alittle sensitive and breast heavier. LC mentioned to mom that is a good  Sign - mature milk is coming in. [facility] also reviewed breast feeding information via Baby and Me booklet .  Per mom has a DEBP at home , and [facility] showed mom how to use the hand pump.  LC offered mom and LC O/P appt before D/C and mom requested to be able to call back for [facility] O/P appt. When settled at home. Mother informed of post-discharge support and given phone number to the lactation department, including  services for phone call assistance; out-patient appointments; and breastfeeding support group. List of other breastfeeding resources in the community given in the handout. Encouraged mother to call for problems or concerns related to breastfeeding.    Maternal Data Has patient been taught Hand Expression?: Yes Does the patient have breastfeeding experience prior to this delivery?: No  Feeding Feeding Type: Breast Fed Length of feed: 15 min (baby latched well, and after 5-6 swallows, released . wet diaper chnanged and re- latched with depth )  LATCH Score/Interventions Latch: Grasps breast easily, tongue down, lips flanged, rhythmical sucking. Intervention(s): Adjust position;Assist with latch;Breast massage;Breast compression  Audible Swallowing: Spontaneous and intermittent  Type of Nipple: Everted at rest and after stimulation  Comfort (Breast/Nipple): Filling, red/small blisters or bruises, mild/mod discomfort  Problem noted: Filling Interventions (Filling): Hand pump  Hold (Positioning): Assistance needed to correctly position infant at breast and maintain latch. (worked on positioning ) Intervention(s): Breastfeeding basics reviewed;Support Pillows;Position options;Skin to skin  LATCH Score: 8  Lactation Tools Discussed/Used WIC Program: No Pump Review: Milk Storage;Setup, frequency, and cleaning   Consult Status Consult Status: Complete (offered LC O/P appt. and mom requested to be able to call for appt. ) Date: 11/06/15    Myer Haff 11/06/2015, 12:28 PM

## 2015-11-06 NOTE — Discharge Summary (Signed)
Obstetric Discharge Summary Reason for Admission: onset of labor Prenatal Procedures: ultrasound Intrapartum Procedures: spontaneous vaginal delivery Postpartum Procedures: none Complications-Operative and Postpartum: 2 degree perineal laceration Hemoglobin  Date Value Ref Range Status  11/05/2015 11.7 (L) 12.0 - 15.0 g/dL Final   HCT  Date Value Ref Range Status  11/05/2015 33.6 (L) 36.0 - 46.0 % Final    Physical Exam:  General: alert and cooperative Lochia: appropriate Uterine Fundus: firm Incision: healing well DVT Evaluation: No evidence of DVT seen on physical exam. Negative Homan's sign. No cords or calf tenderness. No significant calf/ankle edema.  Discharge Diagnoses: Term Pregnancy-delivered  Discharge Information: Date: 11/06/2015 Activity: pelvic rest Diet: routine Medications: PNV and Ibuprofen Condition: stable Instructions: refer to practice specific booklet Discharge to: home   Newborn Data: Live born female  Birth Weight: 6 lb 1 oz (2750 g) APGAR: 9, 9  Home with mother.  Ramces Shomaker G 11/06/2015, 7:57 AM

## 2016-06-15 ENCOUNTER — Other Ambulatory Visit: Payer: Self-pay | Admitting: Obstetrics and Gynecology

## 2016-06-15 DIAGNOSIS — N6320 Unspecified lump in the left breast, unspecified quadrant: Secondary | ICD-10-CM

## 2016-06-24 ENCOUNTER — Other Ambulatory Visit: Payer: Self-pay | Admitting: Obstetrics and Gynecology

## 2016-06-24 DIAGNOSIS — N6320 Unspecified lump in the left breast, unspecified quadrant: Secondary | ICD-10-CM

## 2016-06-26 ENCOUNTER — Other Ambulatory Visit: Payer: BC Managed Care – PPO

## 2016-06-26 ENCOUNTER — Ambulatory Visit
Admission: RE | Admit: 2016-06-26 | Discharge: 2016-06-26 | Disposition: A | Discharge status: Home / Self Care | Payer: BC Managed Care – PPO | Source: Ambulatory Visit | Attending: Obstetrics and Gynecology | Admitting: Obstetrics and Gynecology

## 2016-06-26 DIAGNOSIS — N6320 Unspecified lump in the left breast, unspecified quadrant: Secondary | ICD-10-CM

## 2017-01-04 ENCOUNTER — Other Ambulatory Visit: Payer: Self-pay | Admitting: Obstetrics and Gynecology

## 2017-01-04 DIAGNOSIS — N6320 Unspecified lump in the left breast, unspecified quadrant: Secondary | ICD-10-CM

## 2017-01-18 ENCOUNTER — Other Ambulatory Visit: Payer: Self-pay | Admitting: Obstetrics and Gynecology

## 2017-01-18 ENCOUNTER — Ambulatory Visit
Admission: RE | Admit: 2017-01-18 | Discharge: 2017-01-18 | Disposition: A | Discharge status: Home / Self Care | Payer: BC Managed Care – PPO | Source: Ambulatory Visit | Attending: Obstetrics and Gynecology | Admitting: Obstetrics and Gynecology

## 2017-01-18 DIAGNOSIS — N6320 Unspecified lump in the left breast, unspecified quadrant: Secondary | ICD-10-CM

## 2017-01-19 ENCOUNTER — Other Ambulatory Visit: Payer: Self-pay | Admitting: Allergy & Immunology

## 2017-01-19 ENCOUNTER — Ambulatory Visit (INDEPENDENT_AMBULATORY_CARE_PROVIDER_SITE_OTHER): Payer: BC Managed Care – PPO | Admitting: Allergy & Immunology

## 2017-01-19 ENCOUNTER — Encounter: Payer: Self-pay | Admitting: Allergy & Immunology

## 2017-01-19 VITALS — BP 118/68 | HR 71 | Temp 98.5°F | Resp 17 | Ht 61.61 in | Wt 126.8 lb

## 2017-01-19 DIAGNOSIS — L500 Allergic urticaria: Secondary | ICD-10-CM | POA: Insufficient documentation

## 2017-01-19 DIAGNOSIS — J3089 Other allergic rhinitis: Secondary | ICD-10-CM | POA: Diagnosis not present

## 2017-01-19 MED ORDER — IPRATROPIUM BROMIDE 0.06 % NA SOLN: 1.0000 | Freq: Four times a day (QID) | NASAL | 5 refills | Status: DC | PRN

## 2017-01-19 MED ORDER — RANITIDINE HCL 300 MG PO TABS: 300.0000 mg | ORAL_TABLET | Freq: Every day | ORAL | 5 refills | Status: DC

## 2017-01-19 NOTE — Patient Instructions (Addendum)
1. Chronic urticaria - Your history does not have any "red flags" such as fevers, joint pains, or permanent skin changes that would be concerning for a more serious cause of hives.  - We will hold off on an extensive lab workup at this time. - Testing to the following foods was negative: soy, tomato, chicken, [place], beef, pork, and lamb. - We will get some lab work to rule out alpha gal syndrome as well as a chili pepper panel. - Chronic hives are often times a self limited process and will "burn themselves out" over 6-12 months, although this is not always the case.  - In the meantime, start suppressive dosing of antihistamines:   - Evening: Allegra (fexofenadine) 180mg  (one tablet) OR  Zyrtec (cetirizine) 10mg  (one tablet)  - If the above is not working, try adding: Zantac (ranitidine) 300mg  (two tablets) - You can change this dosing at home, decreasing the dose as needed or increasing the dosing as needed.  - You can add on morning doses if the hives become more of an issue during the daytime as well.   2. Chronic rhinitis - Testing today showed: indoor molds, outdoor molds and dust mites  - Avoidance measures provided. - I am not sure how much these are contributing to your nasal symptoms since your nasal symptoms seem more consistent with vasomotor rhinitis (which occurs with changes in weather, for instance). - However, the dust mites could be contributing to your hives.  - Start taking: Atrovent (ipratropium) one spray per nostril every 6 hours as needed for runny nose + the antihistamines for the hives - You can use an extra dose of the antihistamine, if needed, for breakthrough symptoms.  - Consider nasal saline rinses 1-2 times daily to remove allergens from the nasal cavities as well as help with mucous clearance (this is especially helpful to do before the nasal sprays are given).  3. Return in about 3 months (around 04/21/2017).   Please inform us of any Emergency Department  visits, hospitalizations, or changes in symptoms. Call us before going to the ED for breathing or allergy symptoms since we might be able to fit you in for a sick visit. Feel free to contact us anytime with any questions, problems, or concerns.  It was a pleasure to meet you today! Enjoy the fall season!  Websites that have reliable patient information: 1. American Academy of Asthma, Allergy, and Immunology: www.aaaai.org 2. Food Allergy Research and Education (FARE): foodallergy.org 3. Mothers of Asthmatics: http://www.asthmacommunitynetwork.org 4. American College of Allergy, Asthma, and Immunology: www.acaai.org   Control of House Dust Mite Allergen    House dust mites play a major role in allergic asthma and rhinitis.  They occur in environments with high humidity wherever human skin, the food for dust mites is found. High levels have been detected in dust obtained from mattresses, pillows, carpets, upholstered furniture, bed covers, clothes and soft toys.  The principal allergen of the house dust mite is found in its feces.  A gram of dust may contain 1,000 mites and 250,000 fecal particles.  Mite antigen is easily measured in the air during house cleaning activities.    1. Encase mattresses, including the box spring, and pillow, in an air tight cover.  Seal the zipper end of the encased mattresses with wide adhesive tape. 2. Wash the bedding in water of 130 degrees Farenheit weekly.  Avoid cotton comforters/quilts and flannel bedding: the most ideal bed covering is the dacron comforter. 3. Remove all  upholstered furniture from the bedroom. 4. Remove carpets, carpet padding, rugs, and non-washable window drapes from the bedroom.  Wash drapes weekly or use plastic window coverings. 5. Remove all non-washable stuffed toys from the bedroom.  Wash stuffed toys weekly. 6. Have the room cleaned frequently with a vacuum cleaner and a damp dust-mop.  The patient should not be in a room which is  being cleaned and should wait 1 hour after cleaning before going into the room. 7. Close and seal all heating outlets in the bedroom.  Otherwise, the room will become filled with dust-laden air.  An electric heater can be used to heat the room. 8. Reduce indoor humidity to less than 50%.  Do not use a humidifier.  Control of Mold Allergen   Mold and fungi can grow on a variety of surfaces provided certain temperature and moisture conditions exist.  Outdoor molds grow on plants, decaying vegetation and soil.  The major outdoor mold, Alternaria and Cladosporium, are found in very high numbers during hot and dry conditions.  Generally, a late Summer - Fall peak is seen for common outdoor fungal spores.  Rain will temporarily lower outdoor mold spore count, but counts rise rapidly when the rainy period ends.  The most important indoor molds are Aspergillus and Penicillium.  Dark, humid and poorly ventilated basements are ideal sites for mold growth.  The next most common sites of mold growth are the bathroom and the kitchen.  Outdoor (Seasonal) Mold Control  Positive outdoor molds via skin testing: Cladosporium  1. Use air conditioning and keep windows closed 2. Avoid exposure to decaying vegetation. 3. Avoid leaf raking. 4. Avoid grain handling. 5. Consider wearing a face mask if working in moldy areas.    Indoor (Perennial) Mold Control   Positive indoor molds via skin testing: Aspergillus and Candida  1. Maintain humidity below 50%. 2. Clean washable surfaces with 5% bleach solution. 3. Remove sources e.g. contaminated carpets.

## 2017-01-19 NOTE — Progress Notes (Signed)
NEW PATIENT  Date of Service/Encounter:  01/19/17  Referring provider: Guadalupe Dawn, MD   Assessment:   Allergic urticaria  Perennial allergic rhinitis (molds, dust mites)   Plan/Recommendations:   1. Chronic urticaria - Your history does not have any "red flags" such as fevers, joint pains, or permanent skin changes that would be concerning for a more serious cause of hives.  - We will hold off on an extensive lab workup at this time. - Testing to the following foods was negative: soy, tomato, chicken, [place], beef, pork, and lamb. - We will get some lab work to rule out alpha gal syndrome as well as a chili pepper panel. - I am not convinced that this is related to foods, given the long time course between ingestion of meals and the emergence of hives. - The time course does fit with alpha-gal syndrome, however, although red meat is not a consistent exposure with her history.  - Chronic hives are often times a self limited process and will "burn themselves out" over 6-12 months, although this is not always the case.  - In the meantime, start suppressive dosing of antihistamines:   - Evening: Allegra (fexofenadine) 180mg  (one tablet) OR  Zyrtec (cetirizine) 10mg  (one tablet)  - If the above is not working, try adding: Zantac (ranitidine) 300mg  (two tablets) - You can change this dosing at home, decreasing the dose as needed or increasing the dosing as needed.  - You can add on morning doses if the hives become more of an issue during the daytime as well.   2. Chronic rhinitis - Testing today showed: indoor molds, outdoor molds and dust mites  - Avoidance measures provided. - I am not sure how much these are contributing to your nasal symptoms since your nasal symptoms seem more consistent with vasomotor rhinitis (which occurs with changes in weather, for instance). - However, the dust mites could be contributing to your hives.  - Start taking: Atrovent (ipratropium) one  spray per nostril every 6 hours as needed for runny nose + the antihistamines for the hives - You can use an extra dose of the antihistamine, if needed, for breakthrough symptoms.  - Consider nasal saline rinses 1-2 times daily to remove allergens from the nasal cavities as well as help with mucous clearance (this is especially helpful to do before the nasal sprays are given).  3. Return in about 3 months (around 04/21/2017).    Subjective:   Jenine Krisher is a 36 y.o. female presenting today for evaluation of  Chief Complaint  Patient presents with  . Allergic Reaction    Sakeenah Valcarcel has a history of the following: Patient Active Problem List   Diagnosis Date Noted  . Perennial allergic rhinitis 01/19/2017  . Allergic urticaria 01/19/2017  . Active labor at term 11/04/2015    History obtained from: chart review and patient.  Renne Crigler was referred by Guadalupe Dawn, MD.     Shayda is a 36 y.o. female presenting for an evaluation of hives. Reactions started around the beginning of September. She will develop hives over her stomach, thighs, and arms. They are typically in the middle of the night. She denies shortness of breath, stomach, pain, diarrhea, or other systemic symptoms. Triggers include pizza, BBQ sauce and chips. It also occurred when she got the fettucine alfredo, but some other people at the table had seafood with a BBQ seasoning. She thinks that it was related to cross contamination. She does eat bacon occasionally  and a hamburger every now and then. Typically she only eats chicken and [place].  There have been a number of triggers. For instance, she did have peanut M&Ms, salad with ranch dressing, alfredo, and sparkling red grape juice. She developed hives on her stomach and back that night. She takes Beandryl with improvement in the symptoms. There are no residual skin changes or fevers. She denies joint pain. She also tried using Cajun seasoning on chicken and  had hives that night. She does tolerate red and green pepper. She tolerates garlic as well as black pepper without a problem. She does tolerate oregano without a problem.  [employer] Cajun Seasoning   Estefania does report some rhinorrhea and congestion during the weather changes. This started in the last couple of years. She denies itchy watery eyes. She has never taken anything to address any of these symptoms. She has never been allergy tested in the past. She does not have dogs herself, but there are neighbors in her apartment complex that have dogs. She does not use dust mite coverings on her bedding.   Otherwise, there is no history of other atopic diseases, including asthma, drug allergies, or stinging insect allergies. There is no significant infectious history. Vaccinations are up to date.    Past Medical History: Patient Active Problem List   Diagnosis Date Noted  . Perennial allergic rhinitis 01/19/2017  . Allergic urticaria 01/19/2017  . Active labor at term 11/04/2015    Medication List:  Allergies as of 01/19/2017   No Known Allergies     Medication List        Accurate as of 01/19/17 12:22 PM. Always use your most recent med list.          ipratropium 0.06 % nasal spray Commonly known as:  ATROVENT Place 1 spray every 6 (six) hours as needed into both nostrils for rhinitis.   ranitidine 300 MG tablet Commonly known as:  ZANTAC Take 1 tablet (300 mg total) at bedtime by mouth.       Birth History: non-contributory.  Developmental History: non-contributory.   Past Surgical History: Past Surgical History:  Procedure Laterality Date  . NO PAST SURGERIES       Family History: Family History  Problem Relation Age of Onset  . Miscarriages / Stillbirths Maternal Aunt   . Cancer Maternal Grandmother   . Breast cancer Maternal Grandmother   . Stroke Paternal Grandmother      Social History: Fritzie lives at home with her husband and 18mo daughter.  There are no pets. She lives in an apartment with electric heating and central cooling. There is carpeting throughout the home. There are no dust mite covers on the bedding. She is a Pharmacist, hospital on kindergartners and first graders. Her husband in a band teacher in [place]. They had a boy born at [redacted]wks gestation around two years ago, who unfortunately passed away at five days of age.     Review of Systems: a 14-point review of systems is pertinent for what is mentioned in HPI.  Otherwise, all other systems were negative. Constitutional: negative other than that listed in the HPI Eyes: negative other than that listed in the HPI Ears, nose, mouth, throat, and face: negative other than that listed in the HPI Respiratory: negative other than that listed in the HPI Cardiovascular: negative other than that listed in the HPI Gastrointestinal: negative other than that listed in the HPI Genitourinary: negative other than that listed in the HPI Integument: negative other  than that listed in the HPI Hematologic: negative other than that listed in the HPI Musculoskeletal: negative other than that listed in the HPI Neurological: negative other than that listed in the HPI Allergy/Immunologic: negative other than that listed in the HPI    Objective:   Blood pressure 118/68, pulse 71, temperature 98.5 F (36.9 C), resp. rate 17, height 5' 1.61" (1.565 m), weight 126 lb 12.8 oz (57.5 kg), SpO2 99 %, unknown if currently breastfeeding. Body mass index is 23.48 kg/m.   Physical Exam:  General: Alert, interactive, in no acute distress. Pleasant and smiling.  Eyes: No conjunctival injection bilaterally, no discharge on the right, no discharge on the left and no Horner-Trantas dots present. PERRL bilaterally. EOMI without pain. No photophobia.  Ears: Right TM pearly gray with normal light reflex, Left TM pearly gray with normal light reflex, Right TM intact without perforation and Left TM intact  without perforation.  Nose/Throat: External nose within normal limits, nasal crease present and septum midline. Turbinates edematous with clear discharge. Posterior oropharynx mildly erythematous without cobblestoning in the posterior oropharynx. Tonsils 2+ without exudates.  Tongue without thrush. Neck: Supple without thyromegaly. Trachea midline. Adenopathy: no enlarged lymph nodes appreciated in the anterior cervical, occipital, axillary, epitrochlear, inguinal, or popliteal regions. Lungs: Clear to auscultation without wheezing, rhonchi or rales. No increased work of breathing. CV: Normal S1/S2. No murmurs. Capillary refill <2 seconds.  Abdomen: Nondistended, nontender. No guarding or rebound tenderness. Bowel sounds present in all fields and hypoactive  Skin: Warm and dry, without lesions or rashes. Extremities:  No clubbing, cyanosis or edema. Neuro:   Grossly intact. No focal deficits appreciated. Responsive to questions.  Diagnostic studies:    Allergy Studies:   Indoor/Outdoor Percutaneous Adult Environmental Panel: positive to Cladosporium, Aspergillus, Candida, Df mite and Dp mites. Otherwise negative with adequate controls.  Selected Food Panel: negative to Soy, Milk, Casein, Tomato, Pork, [place], Beef, Lamb and Chicken with adequate controls.        Salvatore Marvel, MD [facility]

## 2017-01-22 LAB — ALPHA-GAL PANEL
Class Interpretation: 0
LAMB CLASS INTERPRETATION: 0
Lamb/Mutton (Ovis spp) IgE: <0.1 kU/L (ref <0.35)
PORK CLASS INTERPRETATION: 0

## 2017-01-22 LAB — ALLERGEN,CHILI PEPPER,RF279: F279-IgE Chili Pepper: <0.1 kU/L

## 2017-01-22 LAB — F263-IGE GREEN BELL PEPPER: Allergen Green Bell Pepper IgE: <0.1 kU/L

## 2017-04-20 ENCOUNTER — Ambulatory Visit: Payer: BC Managed Care – PPO | Admitting: Allergy & Immunology

## 2017-07-28 ENCOUNTER — Other Ambulatory Visit: Payer: BC Managed Care – PPO

## 2017-08-10 ENCOUNTER — Other Ambulatory Visit: Payer: BC Managed Care – PPO

## 2017-08-20 ENCOUNTER — Ambulatory Visit
Admission: RE | Admit: 2017-08-20 | Discharge: 2017-08-20 | Disposition: A | Discharge status: Home / Self Care | Payer: BC Managed Care – PPO | Source: Ambulatory Visit | Attending: Obstetrics and Gynecology | Admitting: Obstetrics and Gynecology

## 2017-08-20 DIAGNOSIS — N6320 Unspecified lump in the left breast, unspecified quadrant: Secondary | ICD-10-CM

## 2018-01-22 ENCOUNTER — Encounter (HOSPITAL_COMMUNITY): Payer: Self-pay

## 2018-01-22 ENCOUNTER — Ambulatory Visit (HOSPITAL_COMMUNITY)
Admission: EM | Admit: 2018-01-22 | Discharge: 2018-01-22 | Disposition: A | Discharge status: Home / Self Care | Payer: BC Managed Care – PPO | Attending: Radiology | Admitting: Radiology

## 2018-01-22 ENCOUNTER — Other Ambulatory Visit: Payer: Self-pay

## 2018-01-22 ENCOUNTER — Ambulatory Visit (INDEPENDENT_AMBULATORY_CARE_PROVIDER_SITE_OTHER): Payer: BC Managed Care – PPO

## 2018-01-22 DIAGNOSIS — R1010 Upper abdominal pain, unspecified: Secondary | ICD-10-CM

## 2018-01-22 MED ORDER — MAGNESIUM CITRATE PO SOLN: 1.0000 | Freq: Once | ORAL | 1 refills | Status: AC

## 2018-01-22 NOTE — ED Provider Notes (Signed)
[facility]    CSN: [id number] Arrival date & time: 01/22/18  1345     History   Chief Complaint Chief Complaint  Patient presents with  . Abdominal Pain    HPI Renee Coleman is a 37 y.o. female.   37 year old female presents with intermittent upper epigastric abdominal pain patient describes as stabbing with diarrhea x5 days.  Condition is acute in nature.  Condition is made better by nothing.  Is made worse by nothing.  Patient denies any treatment prior to arrival this facility.  Patient states that she read on the Internet to eat an apple and then had diarrhea followed by oatmeal with persistent diarrhea.  Patient states that she is not pregnant.  Patient denies any nausea vomiting or pain with urination     Past Medical History:  Diagnosis Date  . Fibroid    uterine  . Medical history non-contributory     Patient Active Problem List   Diagnosis Date Noted  . Perennial allergic rhinitis 01/19/2017  . Allergic urticaria 01/19/2017  . Active labor at term 11/04/2015    Past Surgical History:  Procedure Laterality Date  . CERVICAL CERCLAGE N/A 11/28/2014   Procedure: CERCLAGE CERVICAL;  Surgeon: Marylynn Pearson, MD;  Location: [facility];  Service: Gynecology;  Laterality: N/A;  . CERVICAL CERCLAGE N/A 05/24/2015   Procedure: CERCLAGE CERVICAL;  Surgeon: Marylynn Pearson, MD;  Location: [facility];  Service: Gynecology;  Laterality: N/A;  . NO PAST SURGERIES      OB History    Gravida  2   Para  2   Term  1   Preterm  1   AB  0   Living  1     SAB  0   TAB      Ectopic      Multiple  0   Live Births  2            Home Medications    Prior to Admission medications   Medication Sig Start Date End Date Taking? Authorizing Provider  ipratropium (ATROVENT) 0.06 % nasal spray Place 1 spray every 6 (six) hours as needed into both nostrils for rhinitis. 01/19/17   Valentina Shaggy, MD  ranitidine (ZANTAC) 300 MG tablet Take 1 tablet  (300 mg total) at bedtime by mouth. 01/19/17   Valentina Shaggy, MD    Family History Family History  Problem Relation Age of Onset  . Miscarriages / Stillbirths Maternal Aunt   . Cancer Maternal Grandmother   . Breast cancer Maternal Grandmother   . Stroke Paternal Grandmother     Social History Social History   Tobacco Use  . Smoking status: Never Smoker  . Smokeless tobacco: Never Used  Substance Use Topics  . Alcohol use: Yes    Alcohol/week: 1.0 standard drinks    Types: 1 Glasses of wine per week    Comment: 1-2 drinks per day before preg  . Drug use: No     Allergies   Patient has no known allergies.   Review of Systems Review of Systems  Constitutional: Negative for chills and fever.  HENT: Negative for ear pain and sore throat.   Eyes: Negative for pain and visual disturbance.  Respiratory: Negative for cough and shortness of breath.   Cardiovascular: Negative for chest pain and palpitations.  Gastrointestinal: Negative for vomiting. Abdominal pain:  upper epigastric.  Genitourinary: Negative for dysuria and hematuria.  Musculoskeletal: Negative for arthralgias and back pain.  Skin: Negative  for color change and rash.  Neurological: Negative for seizures and syncope.  All other systems reviewed and are negative.    Physical Exam Triage Vital Signs ED Triage Vitals  Enc Vitals Group     BP 01/22/18 1412 134/78     Pulse Rate 01/22/18 1412 60     Resp 01/22/18 1412 16     Temp 01/22/18 1412 98.2 F (36.8 C)     Temp Source 01/22/18 1412 Oral     SpO2 01/22/18 1412 100 %     Weight --      Height --      Head Circumference --      Peak Flow --      Pain Score 01/22/18 1414 0     Pain Loc --      Pain Edu? --      Excl. in [place]? --    No data found.  Updated Vital Signs BP 134/78 (BP Location: Left Arm)   Pulse 60   Temp 98.2 F (36.8 C) (Oral)   Resp 16   LMP 12/31/2017 (Exact Date)   SpO2 100%   Visual Acuity Right Eye  Distance:   Left Eye Distance:   Bilateral Distance:    Right Eye Near:   Left Eye Near:    Bilateral Near:     Physical Exam  Constitutional: She is oriented to person, place, and time. She appears well-developed and well-nourished.  HENT:  Head: Normocephalic and atraumatic.  Eyes: Conjunctivae are normal.  Neck: Normal range of motion.  Pulmonary/Chest: Effort normal.  Abdominal: Normal appearance. Bowel sounds are increased. There is tenderness in the right upper quadrant and left upper quadrant.  Neurological: She is alert and oriented to person, place, and time.  Skin: Skin is warm and dry.  Psychiatric: She has a normal mood and affect.  Nursing note and vitals reviewed.    UC Treatments / Results  Labs (all labs ordered are listed, but only abnormal results are displayed) Labs Reviewed - No data to display  EKG None  Radiology No results found.  Procedures Procedures (including critical care time)  Medications Ordered in UC Medications - No data to display  Initial Impression / Assessment and Plan / UC Course  I have reviewed the triage vital signs and the nursing notes.  Pertinent labs & imaging results that were available during my care of the patient were reviewed by me and considered in my medical decision making (see chart for details).      Final Clinical Impressions(s) / UC Diagnoses   Final diagnoses:  None   Discharge Instructions   None    ED Prescriptions    None     Controlled Substance Prescriptions [place] Controlled Substance Registry consulted? Not Applicable   Jacqualine Mau, NP 01/22/18 1722

## 2018-01-22 NOTE — ED Triage Notes (Signed)
Pt presents to [facility] for generalized abdominal pain x5 days, pt complains of pain feeling stabbing and sharp.

## 2018-01-22 NOTE — Discharge Instructions (Addendum)
If symptoms do not improve recommend seeing PMD for [place] to rule out cholecystitis.

## 2018-04-24 ENCOUNTER — Ambulatory Visit (HOSPITAL_COMMUNITY)
Admission: EM | Admit: 2018-04-24 | Discharge: 2018-04-24 | Disposition: A | Discharge status: Home / Self Care | Payer: BC Managed Care – PPO | Attending: Family Medicine | Admitting: Family Medicine

## 2018-04-24 ENCOUNTER — Encounter (HOSPITAL_COMMUNITY): Payer: Self-pay

## 2018-04-24 ENCOUNTER — Other Ambulatory Visit: Payer: Self-pay

## 2018-04-24 DIAGNOSIS — R030 Elevated blood-pressure reading, without diagnosis of hypertension: Secondary | ICD-10-CM | POA: Diagnosis not present

## 2018-04-24 DIAGNOSIS — J111 Influenza due to unidentified influenza virus with other respiratory manifestations: Secondary | ICD-10-CM

## 2018-04-24 DIAGNOSIS — R69 Illness, unspecified: Secondary | ICD-10-CM

## 2018-04-24 MED ORDER — IBUPROFEN 800 MG PO TABS: 800.0000 mg | ORAL_TABLET | Freq: Three times a day (TID) | ORAL | 0 refills | Status: DC

## 2018-04-24 MED ORDER — BENZONATATE 200 MG PO CAPS: 200.0000 mg | ORAL_CAPSULE | Freq: Two times a day (BID) | ORAL | 0 refills | Status: DC | PRN

## 2018-04-24 NOTE — ED Triage Notes (Signed)
Pt cc lower back pain an, headache and fatigued x 2 days.

## 2018-04-24 NOTE — ED Provider Notes (Signed)
[facility]    CSN: [id number] Arrival date & time: 04/24/18  1210     History   Chief Complaint Chief Complaint  Patient presents with  . Back Pain    HPI Renee Coleman is a 38 y.o. female.   HPI  Patient is Radio producer.  She has back pain, body pain, headache, and fatigue for 2 days.  She has a temperature at home of over 100.  Also cough and some chest congestion.  No productive sputum.  Mild runny nose.  Sore throat.  She states there has been influenza and strep in the school.  Past Medical History:  Diagnosis Date  . Fibroid    uterine  . Medical history non-contributory     Patient Active Problem List   Diagnosis Date Noted  . Perennial allergic rhinitis 01/19/2017  . Allergic urticaria 01/19/2017  . Active labor at term 11/04/2015    Past Surgical History:  Procedure Laterality Date  . CERVICAL CERCLAGE N/A 11/28/2014   Procedure: CERCLAGE CERVICAL;  Surgeon: Marylynn Pearson, MD;  Location: [facility];  Service: Gynecology;  Laterality: N/A;  . CERVICAL CERCLAGE N/A 05/24/2015   Procedure: CERCLAGE CERVICAL;  Surgeon: Marylynn Pearson, MD;  Location: [facility];  Service: Gynecology;  Laterality: N/A;  . NO PAST SURGERIES      OB History    Gravida  2   Para  2   Term  1   Preterm  1   AB  0   Living  1     SAB  0   TAB      Ectopic      Multiple  0   Live Births  2            Home Medications    Prior to Admission medications   Medication Sig Start Date End Date Taking? Authorizing Provider  benzonatate (TESSALON) 200 MG capsule Take 1 capsule (200 mg total) by mouth 2 (two) times daily as needed for cough. 04/24/18   Raylene Everts, MD  ibuprofen (ADVIL,MOTRIN) 800 MG tablet Take 1 tablet (800 mg total) by mouth 3 (three) times daily. 04/24/18   Raylene Everts, MD  ipratropium (ATROVENT) 0.06 % nasal spray Place 1 spray every 6 (six) hours as needed into both nostrils for rhinitis. 01/19/17   Valentina Shaggy, MD   ranitidine (ZANTAC) 300 MG tablet Take 1 tablet (300 mg total) at bedtime by mouth. 01/19/17   Valentina Shaggy, MD    Family History Family History  Problem Relation Age of Onset  . Miscarriages / Stillbirths Maternal Aunt   . Cancer Maternal Grandmother   . Breast cancer Maternal Grandmother   . Stroke Paternal Grandmother     Social History Social History   Tobacco Use  . Smoking status: Never Smoker  . Smokeless tobacco: Never Used  Substance Use Topics  . Alcohol use: Yes    Alcohol/week: 1.0 standard drinks    Types: 1 Glasses of wine per week    Comment: 1-2 drinks per day before preg  . Drug use: No     Allergies   Patient has no known allergies.   Review of Systems Review of Systems  Constitutional: Positive for chills, fatigue and fever.  HENT: Positive for congestion and rhinorrhea. Negative for ear pain and sore throat.   Eyes: Negative for pain and visual disturbance.  Respiratory: Positive for cough. Negative for shortness of breath.   Cardiovascular: Negative for chest pain and  palpitations.  Gastrointestinal: Positive for nausea. Negative for abdominal pain and vomiting.  Genitourinary: Negative for dysuria and hematuria.  Musculoskeletal: Positive for myalgias. Negative for arthralgias and back pain.  Skin: Negative for color change and rash.  Neurological: Negative for seizures and syncope.  All other systems reviewed and are negative.    Physical Exam Triage Vital Signs ED Triage Vitals  Enc Vitals Group     BP 04/24/18 1406 (!) 143/102     Pulse Rate 04/24/18 1406 88     Resp 04/24/18 1406 18     Temp 04/24/18 1406 98.8 F (37.1 C)     Temp src --      SpO2 04/24/18 1406 100 %     Weight 04/24/18 1407 130 lb (59 kg)     Height --      Head Circumference --      Peak Flow --      Pain Score 04/24/18 1407 0     Pain Loc --      Pain Edu? --      Excl. in [place]? --    No data found.  Updated Vital Signs BP (!) 143/102 (BP  Location: Right Arm)   Pulse 88   Temp 98.8 F (37.1 C)   Resp 18   Wt 59 kg   LMP 04/05/2018   SpO2 100%   BMI 24.08 kg/m   Visual Acuity Right Eye Distance:   Left Eye Distance:   Bilateral Distance:    Right Eye Near:   Left Eye Near:    Bilateral Near:     Physical Exam Constitutional:      General: She is not in acute distress.    Appearance: Normal appearance. She is well-developed. She is ill-appearing.  HENT:     Head: Normocephalic and atraumatic.     Right Ear: Tympanic membrane, ear canal and external ear normal.     Left Ear: Tympanic membrane, ear canal and external ear normal.     Nose: Congestion and rhinorrhea present.     Mouth/Throat:     Pharynx: Posterior oropharyngeal erythema present.  Eyes:     Conjunctiva/sclera: Conjunctivae normal.     Pupils: Pupils are equal, round, and reactive to light.  Neck:     Musculoskeletal: Normal range of motion.  Cardiovascular:     Rate and Rhythm: Normal rate and regular rhythm.     Heart sounds: Normal heart sounds.  Pulmonary:     Effort: Pulmonary effort is normal. No respiratory distress.     Breath sounds: Normal breath sounds. No wheezing or rhonchi.  Abdominal:     General: There is no distension.     Palpations: Abdomen is soft.  Musculoskeletal: Normal range of motion.  Skin:    General: Skin is warm and dry.  Neurological:     General: No focal deficit present.     Mental Status: She is alert.  Psychiatric:        Mood and Affect: Mood normal.      UC Treatments / Results  Labs (all labs ordered are listed, but only abnormal results are displayed) Labs Reviewed - No data to display  EKG None  Radiology No results found.  Procedures Procedures (including critical care time)  Medications Ordered in UC Medications - No data to display  Initial Impression / Assessment and Plan / UC Course  I have reviewed the triage vital signs and the nursing notes.  Pertinent labs & imaging  results that were available during my care of the patient were reviewed by me and considered in my medical decision making (see chart for details).     Early influenza.  Discussed treatment, expected recovery, complications to watch for Final Clinical Impressions(s) / UC Diagnoses   Final diagnoses:  Elevated blood pressure reading without diagnosis of hypertension  Influenza-like illness     Discharge Instructions     Push fluids Get plenty of rest Take ibuprofen 3 times a day with food Take Tessalon twice a day for cough Follow-up with your primary care doctor when you are feeling better to recheck your blood pressure (it is elevated today)   ED Prescriptions    Medication Sig Dispense Auth. Provider   ibuprofen (ADVIL,MOTRIN) 800 MG tablet Take 1 tablet (800 mg total) by mouth 3 (three) times daily. 21 tablet Raylene Everts, MD   benzonatate (TESSALON) 200 MG capsule Take 1 capsule (200 mg total) by mouth 2 (two) times daily as needed for cough. 20 capsule Raylene Everts, MD     Controlled Substance Prescriptions  Controlled Substance Registry consulted? Not Applicable   Raylene Everts, MD 04/24/18 Doran Heater

## 2018-04-24 NOTE — Discharge Instructions (Signed)
Push fluids Get plenty of rest Take ibuprofen 3 times a day with food Take Tessalon twice a day for cough Follow-up with your primary care doctor when you are feeling better to recheck your blood pressure (it is elevated today)

## 2018-07-29 ENCOUNTER — Other Ambulatory Visit: Payer: Self-pay | Admitting: Obstetrics and Gynecology

## 2018-07-29 ENCOUNTER — Other Ambulatory Visit: Payer: Self-pay | Admitting: Internal Medicine

## 2018-07-29 DIAGNOSIS — N6320 Unspecified lump in the left breast, unspecified quadrant: Secondary | ICD-10-CM

## 2018-08-22 ENCOUNTER — Ambulatory Visit
Admission: RE | Admit: 2018-08-22 | Discharge: 2018-08-22 | Disposition: A | Discharge status: Home / Self Care | Payer: BC Managed Care – PPO | Source: Ambulatory Visit | Attending: Internal Medicine | Admitting: Internal Medicine

## 2018-08-22 ENCOUNTER — Other Ambulatory Visit: Payer: Self-pay

## 2018-08-22 DIAGNOSIS — N6320 Unspecified lump in the left breast, unspecified quadrant: Secondary | ICD-10-CM

## 2018-08-22 IMAGING — US ULTRASOUND LEFT BREAST LIMITED
1 series · 5 of 5 positions shown · non-contrast
Comparison: Previous exam(s).

CLINICAL DATA: 37-year-old patient presents for final ultrasound
follow-up of a probably benign mass in the 9 o'clock retroareolar
left breast. We have previously followed masses in the 12 and 4
o'clock regions of the left breast, probable fibroadenomas, and
these were stable for 2 or greater years and considered benign.

EXAM:
DIGITAL DIAGNOSTIC BILATERAL MAMMOGRAM WITH CAD AND TOMO
ULTRASOUND LEFT BREAST

[Series 1: ultrasound left breast limited · 0.06mm/px · 5 of 5 slices shown]
[im 1/5]
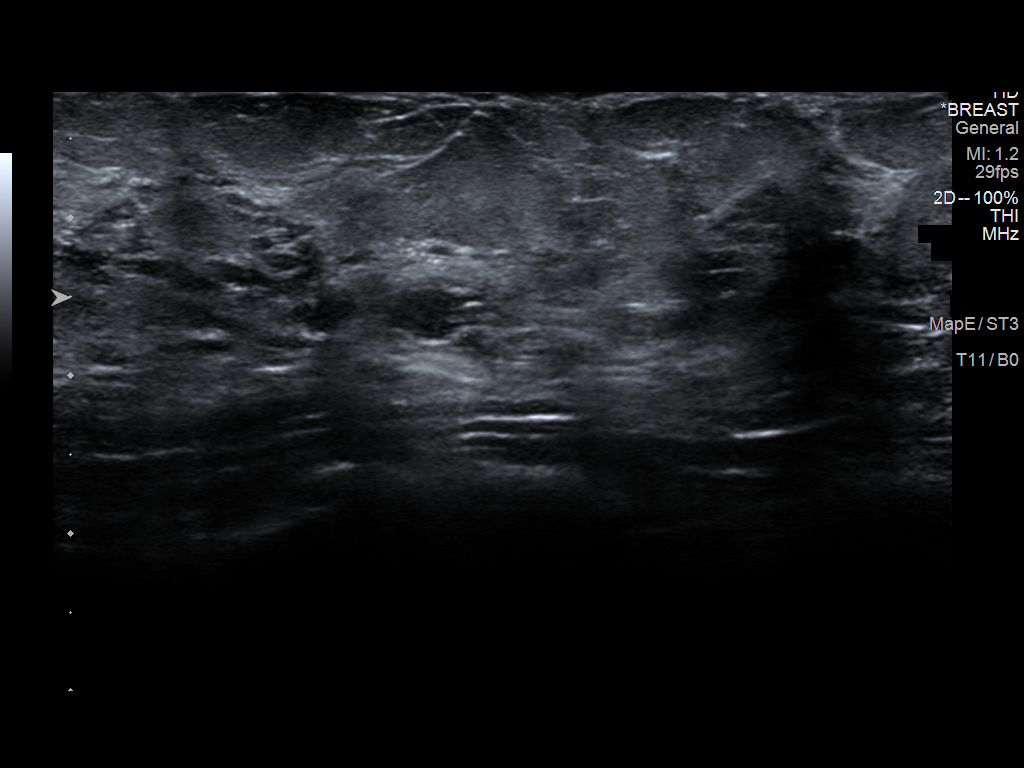
[im 2/5]
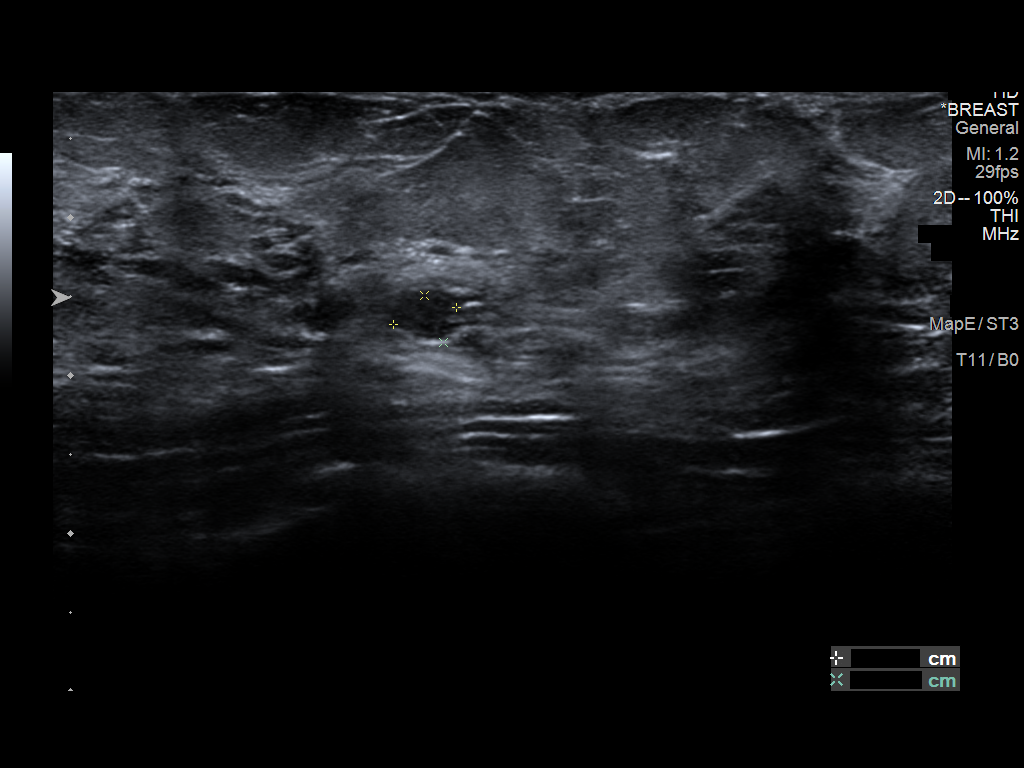
[im 3/5]
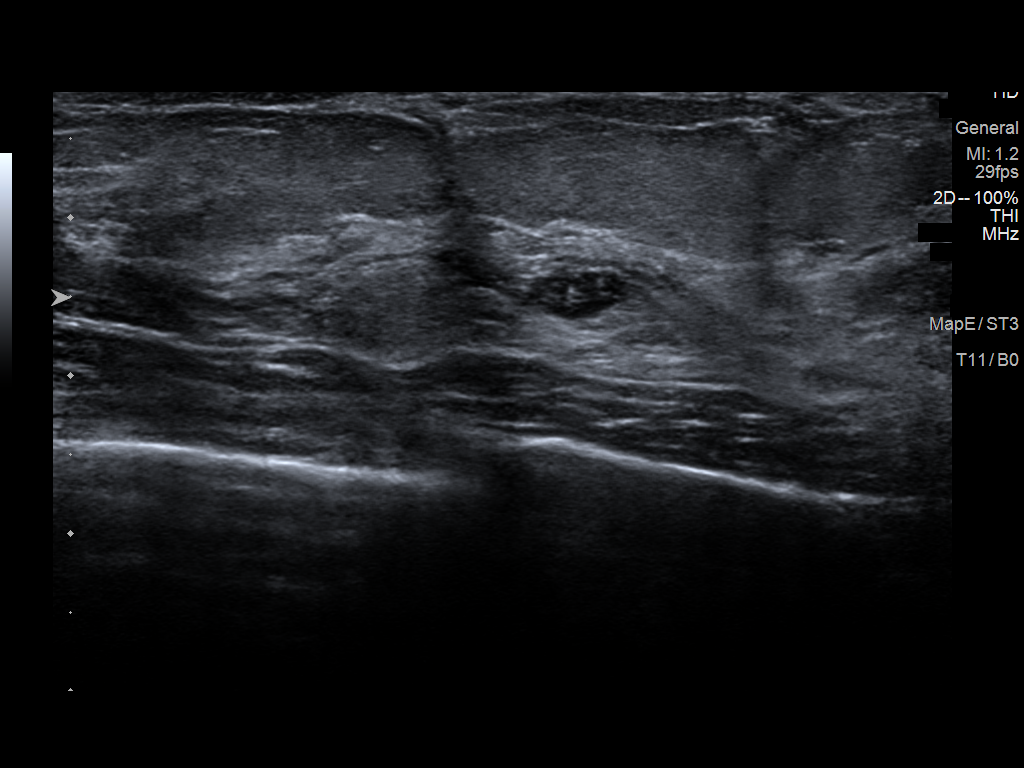
[im 4/5]
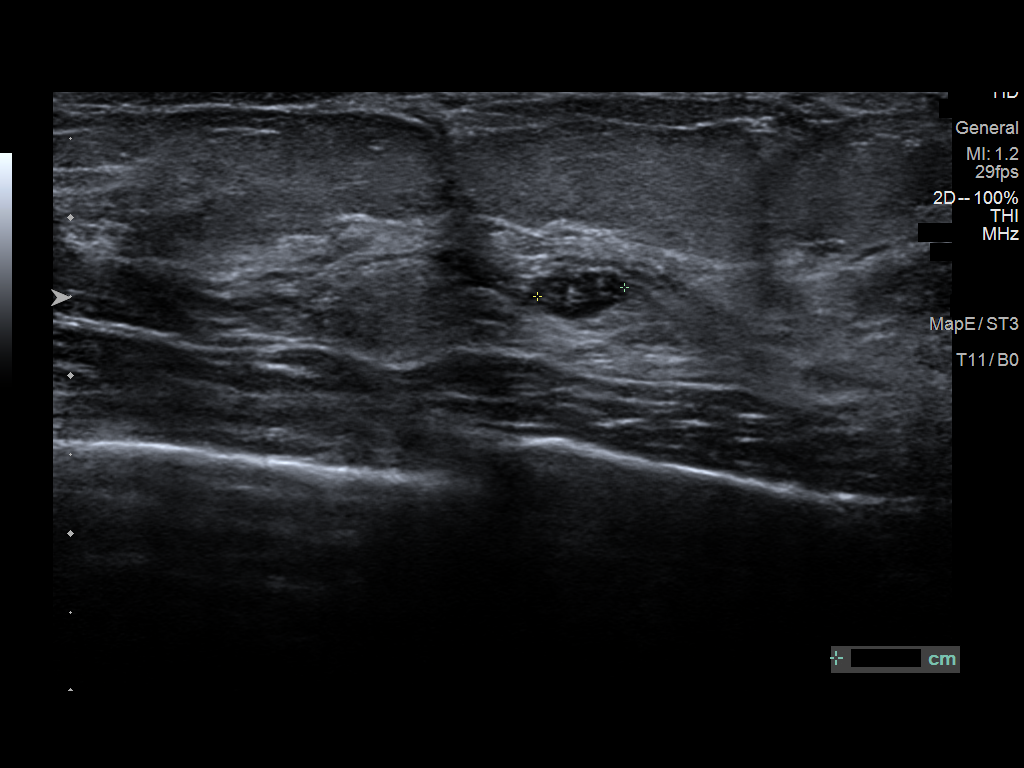
[im 5/5]
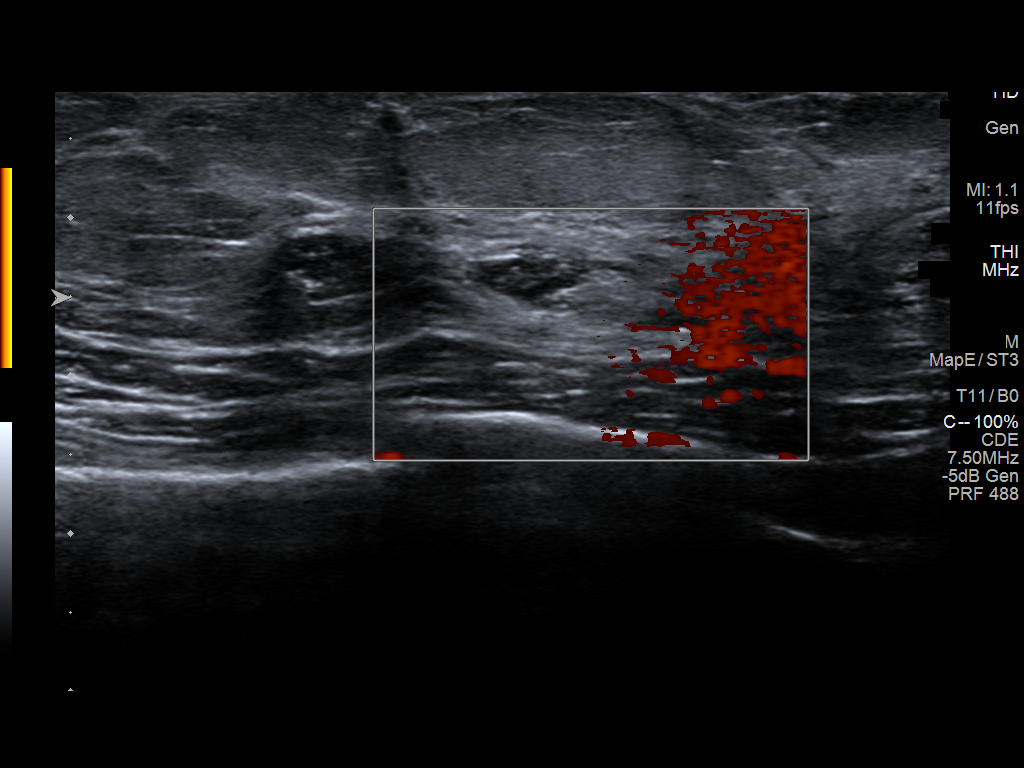

[5 of 5 positions shown; findings below may reference images not displayed]

ACR Breast Density Category c: The breast tissue is heterogeneously
dense, which may obscure small masses.
FINDINGS: No suspicious mass, architectural distortion, or suspicious
microcalcification is identified in either breast to suggest
malignancy.

Mammographic images were processed with CAD.

Targeted ultrasound is performed, showing a circumscribed hypoechoic
oval parallel mass in the 9 o'clock retroareolar left breast
measuring 0.4 x 0.3 x 0.6 cm. This mass is stable and may be a
complicated cyst or fibroadenoma.
IMPRESSION: Stable mass in the 9 o'clock retroareolar left breast. Given at
least 2 years of stability, this can be considered benign.

RECOMMENDATION:
Screening mammogram at age 39 unless there are persistent or
intervening clinical concerns. (Code:[8T])

I have discussed the findings and recommendations with the patient.
Results were also provided in writing at the conclusion of the
visit. If applicable, a reminder letter will be sent to the patient
regarding the next appointment.

BI-RADS CATEGORY  2: Benign.

## 2019-05-14 ENCOUNTER — Ambulatory Visit: Payer: BC Managed Care – PPO | Attending: Internal Medicine

## 2019-05-14 DIAGNOSIS — Z23 Encounter for immunization: Secondary | ICD-10-CM | POA: Insufficient documentation

## 2019-05-14 NOTE — Progress Notes (Signed)
   Covid-19 Vaccination Clinic  Name:  Renee Coleman    MRN: [id number] DOB: [birth year]  05/14/2019  [name] was observed post Covid-19 immunization for 15 minutes without incident. She was provided with Vaccine Information Sheet and instruction to access the V-Safe system.   [name] was instructed to call 911 with any severe reactions post vaccine: [name] Difficulty breathing  . Swelling of face and throat  . A fast heartbeat  . A bad rash all over body  . Dizziness and weakness   Immunizations Administered    Name Date Dose VIS Date Route   Pfizer COVID-19 Vaccine 05/14/2019  6:03 PM 0.3 mL 02/17/2019 Intramuscular   Manufacturer: [place]   Lot: [id number]   [place]: [id number]

## 2019-06-04 ENCOUNTER — Ambulatory Visit: Payer: BC Managed Care – PPO | Attending: Internal Medicine

## 2019-06-04 ENCOUNTER — Other Ambulatory Visit: Payer: Self-pay

## 2019-06-04 DIAGNOSIS — Z23 Encounter for immunization: Secondary | ICD-10-CM

## 2019-06-04 NOTE — Progress Notes (Signed)
   Covid-19 Vaccination Clinic  Name:  Renee Coleman    MRN: [id number] DOB: 27-Jun-1980  06/04/2019  [name] was observed post Covid-19 immunization for 15 minutes without incident. She was provided with Vaccine Information Sheet and instruction to access the V-Safe system.   [name] was instructed to call 911 with any severe reactions post vaccine: [name] Difficulty breathing  . Swelling of face and throat  . A fast heartbeat  . A bad rash all over body  . Dizziness and weakness   Immunizations Administered    Name Date Dose VIS Date Route   Pfizer COVID-19 Vaccine 06/04/2019  3:47 PM 0.3 mL 02/17/2019 Intramuscular   Manufacturer: [place]   Lot: [id number]   [place]: [id number]

## 2019-08-14 ENCOUNTER — Other Ambulatory Visit: Payer: Self-pay | Admitting: Obstetrics and Gynecology

## 2019-08-14 DIAGNOSIS — Z1231 Encounter for screening mammogram for malignant neoplasm of breast: Secondary | ICD-10-CM

## 2019-08-30 ENCOUNTER — Other Ambulatory Visit: Payer: Self-pay

## 2019-08-30 ENCOUNTER — Ambulatory Visit
Admission: RE | Admit: 2019-08-30 | Discharge: 2019-08-30 | Disposition: A | Discharge status: Home / Self Care | Payer: BC Managed Care – PPO | Source: Ambulatory Visit | Attending: Obstetrics and Gynecology | Admitting: Obstetrics and Gynecology

## 2019-08-30 DIAGNOSIS — Z1231 Encounter for screening mammogram for malignant neoplasm of breast: Secondary | ICD-10-CM

## 2019-09-04 ENCOUNTER — Other Ambulatory Visit: Payer: Self-pay | Admitting: Obstetrics and Gynecology

## 2019-09-04 DIAGNOSIS — R928 Other abnormal and inconclusive findings on diagnostic imaging of breast: Secondary | ICD-10-CM

## 2019-09-14 ENCOUNTER — Ambulatory Visit: Payer: BC Managed Care – PPO

## 2019-09-14 ENCOUNTER — Ambulatory Visit
Admission: RE | Admit: 2019-09-14 | Discharge: 2019-09-14 | Disposition: A | Discharge status: Home / Self Care | Payer: BC Managed Care – PPO | Source: Ambulatory Visit | Attending: Obstetrics and Gynecology | Admitting: Obstetrics and Gynecology

## 2019-09-14 ENCOUNTER — Other Ambulatory Visit: Payer: Self-pay

## 2019-09-14 DIAGNOSIS — R928 Other abnormal and inconclusive findings on diagnostic imaging of breast: Secondary | ICD-10-CM

## 2020-03-29 ENCOUNTER — Other Ambulatory Visit: Payer: Self-pay

## 2020-03-29 ENCOUNTER — Ambulatory Visit
Admission: EM | Admit: 2020-03-29 | Discharge: 2020-03-29 | Disposition: A | Discharge status: Home / Self Care | Payer: BC Managed Care – PPO | Attending: Family Medicine | Admitting: Family Medicine

## 2020-03-29 ENCOUNTER — Encounter: Payer: Self-pay | Admitting: Emergency Medicine

## 2020-03-29 DIAGNOSIS — R030 Elevated blood-pressure reading, without diagnosis of hypertension: Secondary | ICD-10-CM

## 2020-03-29 DIAGNOSIS — R0981 Nasal congestion: Secondary | ICD-10-CM

## 2020-03-29 DIAGNOSIS — R000 Tachycardia, unspecified: Secondary | ICD-10-CM

## 2020-03-29 DIAGNOSIS — J069 Acute upper respiratory infection, unspecified: Secondary | ICD-10-CM

## 2020-03-29 DIAGNOSIS — R509 Fever, unspecified: Secondary | ICD-10-CM | POA: Diagnosis not present

## 2020-03-29 DIAGNOSIS — R52 Pain, unspecified: Secondary | ICD-10-CM

## 2020-03-29 MED ORDER — ACETAMINOPHEN 325 MG PO TABS
650.0000 mg | ORAL_TABLET | Freq: Once | ORAL | Status: AC
  Administered 2020-03-29: 650 mg via ORAL

## 2020-03-29 NOTE — ED Triage Notes (Signed)
Sore throat, fever, body aches and cough that started yesterday.

## 2020-03-29 NOTE — Discharge Instructions (Addendum)
Tylenol given in office for fever.  Your COVID test is pending.  You should self quarantine until the test result is back.    Take Tylenol or ibuprofen as needed for fever or discomfort.  Rest and keep yourself hydrated.    Follow-up with your primary care provider if your symptoms are not improving.

## 2020-03-29 NOTE — ED Provider Notes (Signed)
[facility]   [id number] 03/29/20 Arrival Time: 1907   CC: COVID symptoms  SUBJECTIVE: History from: patient.  Renee Coleman is a 40 y.o. female who presents with fever, sore throat, body aches, and cough that started yesterday. Denies sick exposure to COVID, flu or strep. Denies recent travel. Has negative history of Covid. Has completed Covid vaccines and booster. Has not taken OTC medications for this. There are no aggravating or alleviating factors. Denies previous symptoms in the past. Denies sinus pain, SOB, wheezing, chest pain, nausea, changes in bowel or bladder habits.    ROS: As per HPI.  All other pertinent ROS negative.     Past Medical History:  Diagnosis Date   Fibroid    uterine   Medical history non-contributory    Past Surgical History:  Procedure Laterality Date   CERVICAL CERCLAGE N/A 11/28/2014   Procedure: CERCLAGE CERVICAL;  Surgeon: Marylynn Pearson, MD;  Location: [facility];  Service: Gynecology;  Laterality: N/A;   CERVICAL CERCLAGE N/A 05/24/2015   Procedure: CERCLAGE CERVICAL;  Surgeon: Marylynn Pearson, MD;  Location: [facility];  Service: Gynecology;  Laterality: N/A;   NO PAST SURGERIES     No Known Allergies No current facility-administered medications on file prior to encounter.   Current Outpatient Medications on File Prior to Encounter  Medication Sig Dispense Refill   benzonatate (TESSALON) 200 MG capsule Take 1 capsule (200 mg total) by mouth 2 (two) times daily as needed for cough. 20 capsule 0   ibuprofen (ADVIL,MOTRIN) 800 MG tablet Take 1 tablet (800 mg total) by mouth 3 (three) times daily. 21 tablet 0   ipratropium (ATROVENT) 0.06 % nasal spray Place 1 spray every 6 (six) hours as needed into both nostrils for rhinitis. 15 mL 5   ranitidine (ZANTAC) 300 MG tablet Take 1 tablet (300 mg total) at bedtime by mouth. 30 tablet 5   Social History   Socioeconomic History   Marital status: Married    Spouse name: Not on file    Number of children: Not on file   Years of education: Not on file   Highest education level: Not on file  Occupational History   Not on file  Tobacco Use   Smoking status: Never Smoker   Smokeless tobacco: Never Used  Vaping Use   Vaping Use: Never used  Substance and Sexual Activity   Alcohol use: Yes    Alcohol/week: 1.0 standard drink    Types: 1 Glasses of wine per week    Comment: 1-2 drinks per day before preg   Drug use: No   Sexual activity: Yes    Birth control/protection: None    Comment: last intercourse Nov 04 2014  Other Topics Concern   Not on file  Social History Narrative   Not on file   Social Determinants of Health   Financial Resource Strain: Not on file  Food Insecurity: Not on file  Transportation Needs: Not on file  Physical Activity: Not on file  Stress: Not on file  Social Connections: Not on file  Intimate Partner Violence: Not on file   Family History  Problem Relation Age of Onset   Miscarriages / Stillbirths Maternal Aunt    Cancer Maternal Grandmother    Breast cancer Maternal Grandmother    Stroke Paternal Grandmother     OBJECTIVE:  Vitals:   03/29/20 1923 03/29/20 1924  BP: (!) 169/118   Pulse: (!) 113   Resp: 17   Temp: (!) 100.8 F (38.2  C)   TempSrc: Oral   SpO2: 99%   Weight:  130 lb 1.1 oz (59 kg)  Height:  5\' 1"  (1.549 m)     General appearance: alert; appears fatigued, but nontoxic; speaking in full sentences and tolerating own secretions HEENT: NCAT; Ears: EACs clear, TMs pearly gray; Eyes: PERRL.  EOM grossly intact. Sinuses: nontender; Nose: nares patent with clear rhinorrhea, Throat: oropharynx erythematous, cobblestoning present, tonsils non erythematous or enlarged, uvula midline  Neck: supple without LAD Lungs: unlabored respirations, symmetrical air entry; cough: mild; no respiratory distress; CTAB Heart: regular rate and rhythm.  Radial pulses 2+ symmetrical bilaterally Skin: warm and  dry Psychological: alert and cooperative; normal mood and affect  LABS:  No results found for this or any previous visit (from the past 24 hour(s)).   ASSESSMENT & PLAN:  1. Viral URI with cough   2. Fever, unspecified fever cause   3. Tachycardia   4. Elevated blood pressure reading without diagnosis of hypertension   5. Body aches   6. Nasal congestion     Meds ordered this encounter  Medications   acetaminophen (TYLENOL) tablet 650 mg   Tylenol given in the office today for fever Continue supportive care at home COVID and flu testing ordered.  It will take between 2-3 days for test results. Someone will contact you regarding abnormal results.   Work note provided Patient should remain in quarantine until they have received Covid results.  If negative you may resume normal activities (go back to work/school) while practicing hand hygiene, social distance, and mask wearing.  If positive, patient should remain in quarantine for at least 5 days from symptom onset AND greater than 72 hours after symptoms resolution (absence of fever without the use of fever-reducing medication and improvement in respiratory symptoms), whichever is longer Get plenty of rest and push fluids Use OTC zyrtec for nasal congestion, runny nose, and/or sore throat Use OTC flonase for nasal congestion and runny nose Use medications daily for symptom relief Use OTC medications like ibuprofen or tylenol as needed fever or pain Call or go to the ED if you have any new or worsening symptoms such as fever, worsening cough, shortness of breath, chest tightness, chest pain, turning blue, changes in mental status.  Reviewed expectations re: course of current medical issues. Questions answered. Outlined signs and symptoms indicating need for more acute intervention. Patient verbalized understanding. After Visit Summary given.         Faustino Congress, NP 03/29/20 1939

## 2020-03-31 LAB — COVID-19, FLU A+B NAA
Influenza A, NAA: NOT DETECTED
Influenza B, NAA: NOT DETECTED
SARS-CoV-2, NAA: DETECTED — AB

## 2020-08-20 ENCOUNTER — Other Ambulatory Visit: Payer: Self-pay | Admitting: Obstetrics and Gynecology

## 2020-08-20 DIAGNOSIS — Z1231 Encounter for screening mammogram for malignant neoplasm of breast: Secondary | ICD-10-CM

## 2020-10-11 ENCOUNTER — Other Ambulatory Visit: Payer: Self-pay

## 2020-10-11 ENCOUNTER — Ambulatory Visit
Admission: RE | Admit: 2020-10-11 | Discharge: 2020-10-11 | Disposition: A | Discharge status: Home / Self Care | Payer: BC Managed Care – PPO | Source: Ambulatory Visit | Attending: Obstetrics and Gynecology | Admitting: Obstetrics and Gynecology

## 2020-10-11 DIAGNOSIS — Z1231 Encounter for screening mammogram for malignant neoplasm of breast: Secondary | ICD-10-CM

## 2021-09-22 ENCOUNTER — Other Ambulatory Visit: Payer: Self-pay | Admitting: Obstetrics and Gynecology

## 2021-09-22 DIAGNOSIS — Z1231 Encounter for screening mammogram for malignant neoplasm of breast: Secondary | ICD-10-CM

## 2021-10-16 ENCOUNTER — Ambulatory Visit
Admission: RE | Admit: 2021-10-16 | Discharge: 2021-10-16 | Disposition: A | Discharge status: Home / Self Care | Payer: BC Managed Care – PPO | Source: Ambulatory Visit | Attending: Obstetrics and Gynecology | Admitting: Obstetrics and Gynecology

## 2021-10-16 DIAGNOSIS — Z1231 Encounter for screening mammogram for malignant neoplasm of breast: Secondary | ICD-10-CM

## 2022-02-25 ENCOUNTER — Encounter: Payer: Self-pay | Admitting: Allergy & Immunology

## 2022-02-25 ENCOUNTER — Ambulatory Visit (INDEPENDENT_AMBULATORY_CARE_PROVIDER_SITE_OTHER): Payer: BC Managed Care – PPO | Admitting: Allergy & Immunology

## 2022-02-25 VITALS — BP 140/82 | HR 75 | Temp 98.0°F | Resp 16 | Ht 62.5 in | Wt 151.6 lb

## 2022-02-25 DIAGNOSIS — L508 Other urticaria: Secondary | ICD-10-CM

## 2022-02-25 DIAGNOSIS — J3089 Other allergic rhinitis: Secondary | ICD-10-CM

## 2022-02-25 DIAGNOSIS — K219 Gastro-esophageal reflux disease without esophagitis: Secondary | ICD-10-CM

## 2022-02-25 MED ORDER — LEVOCETIRIZINE DIHYDROCHLORIDE 5 MG PO TABS: 10.0000 mg | ORAL_TABLET | Freq: Every evening | ORAL | 5 refills | Status: AC

## 2022-02-25 MED ORDER — MONTELUKAST SODIUM 10 MG PO TABS: 10.0000 mg | ORAL_TABLET | Freq: Every day | ORAL | 5 refills | Status: AC

## 2022-02-25 MED ORDER — CETIRIZINE HCL 10 MG PO TABS: 20.0000 mg | ORAL_TABLET | Freq: Every day | ORAL | 5 refills | Status: AC

## 2022-02-25 NOTE — Progress Notes (Signed)
NEW PATIENT  Date of Service/Encounter:  02/25/22  Consult requested by: Marylynn Pearson, MD   Assessment:   Chronic urticaria  Perennial allergic rhinitis (dust mites, molds) - from testing performed in 2018  Intermittently elevated blood pressure (both systolic and diastolic) - letting patient know  Plan/Recommendations:   1. Chronic urticaria - Your history does not have any "red flags" such as fevers, joint pains, or permanent skin changes that would be concerning for a more serious cause of hives.  - We did not do repeat testing since we did this in 2018 already.  - We are going to put you on a lot of medications until we figure out a cause and then make changes accordingly.  - We will get some labs to rule out serious causes of hives: alpha gal panel, complete blood count, tryptase level, chronic urticaria panel, CMP, ESR, and CRP. - Chronic hives are often times a self limited process and will "burn themselves out" over 6-12 months, although this is not always the case.  - In the meantime, start suppressive dosing of antihistamines:   - Morning: Xyzal (levocetirizine) 5-551m (two tablets) + Pepcid (famotidine) 286m - Evening: Zyrtec (cetirizine) 10-2052mtwo tablets) + Pepcid (famotidine) 57m61mSingulair (montelukast) 10mg76mly - You can change this dosing at home, decreasing the dose as needed or increasing the dosing as needed.  - Singulair can cause some mood changes rarely, so beware of that.  - If you are not tolerating the medications or are tired of taking them every day, we can start treatment with a monthly injectable medication called Xolair.   2. Perennial allergic rhinitis (molds, dust mites) - We are starting the antihistamines, which should help. - We are going to not do repeating testing since your symptoms are not too severe.   3. Return in about 6 weeks (around 04/08/2022).     This note in its entirety was forwarded to the Provider who requested  this consultation.  Subjective:   Renee Coleman 41 y.59 female presenting today for evaluation of  Chief Complaint  Patient presents with   Urticaria    Says back in October she began to break out on her legs, shoulders, arms, back, and abdomen. They are random. She has changed her detergent and has not seen a difference.    Renee Coleman history of the following: Patient Active Problem List   Diagnosis Date Noted   Perennial allergic rhinitis 01/19/2017   Allergic urticaria 01/19/2017   Active labor at term 11/04/2015    History obtained from: chart review and patient.  Renee Criglerreferred by AdkinMarylynn Pearson     Renee Coleman 41 y.4 female presenting for an evaluation of urticaria .  She started having hives in [place]. She reports that thesy started on the shoulders and back and arms. She thought that this was detergent related. [facility] felt tht this would help get the hives under better control. She took some Benadryl at that time to help with it.   This seems to only be limited to her skin. She denies GI symptoms, throat swelling or coughing/wheezing. She repors that the hives are migratory. She denies fever or joint pain. She denies a history of RA or lupus or autoimmune disease. No one else in the family has this. She denies other changes in October.   Her diet has remained the same. It never really seems  to be triggered by foods at all. She has peanuts, tree nuts, seafood, wheat, milk containing products, eggs, and soy containing products. She does eat beef rarely. She was not sick at the time that she can remember.     Allergic Rhinitis Symptom History: She does have some issues with hay fever, but they are few and far between. She has never been allergy tested in the past.  She denies sinus or ear infections. She denies symptoms with animals. She denies needing to use nose sprays on a routine basis. She was tested in  November 2018 and she was positive to molds and dust mites.   GERD Symptom History: She has some occasionally heartburn that she does not even treat.  She might take a tums occasionally.   Otherwise, there is no history of other atopic diseases, including asthma, food allergies, drug allergies, stinging insect allergies, or contact dermatitis. There is no significant infectious history. Vaccinations are up to date.    Past Medical History: Patient Active Problem List   Diagnosis Date Noted   Perennial allergic rhinitis 01/19/2017   Allergic urticaria 01/19/2017   Active labor at term 11/04/2015    Medication List:  Allergies as of 02/25/2022   No Known Allergies      Medication List        Accurate as of February 25, 2022  9:44 AM. If you have any questions, ask your nurse or doctor.          STOP taking these medications    benzonatate 200 MG capsule Commonly known as: TESSALON Stopped by: Valentina Shaggy, MD   ibuprofen 800 MG tablet Commonly known as: ADVIL Stopped by: Valentina Shaggy, MD   ipratropium 0.06 % nasal spray Commonly known as: Atrovent Stopped by: Valentina Shaggy, MD   ranitidine 300 MG tablet Commonly known as: Zantac Stopped by: Valentina Shaggy, MD       TAKE these medications    ascorbic Acid 500 MG Cpcr Commonly known as: VITAMIN C Take 500 mg by mouth daily.   CENTRUM WOMEN PO Take 1 tablet by mouth daily.   cetirizine 10 MG tablet Commonly known as: ZYRTEC Take 2 tablets (20 mg total) by mouth at bedtime. Started by: Valentina Shaggy, MD   levocetirizine 5 MG tablet Commonly known as: XYZAL Take 2 tablets (10 mg total) by mouth every evening. Started by: Valentina Shaggy, MD   magnesium gluconate 500 MG tablet Commonly known as: MAGONATE Take 500 mg by mouth daily.   montelukast 10 MG tablet Commonly known as: Singulair Take 1 tablet (10 mg total) by mouth at bedtime. Started by: Valentina Shaggy, MD        Birth History: non-contributory  Developmental History: non-contributory  Past Surgical History: Past Surgical History:  Procedure Laterality Date   CERVICAL CERCLAGE N/A 11/28/2014   Procedure: CERCLAGE CERVICAL;  Surgeon: Marylynn Pearson, MD;  Location: [facility];  Service: Gynecology;  Laterality: N/A;   CERVICAL CERCLAGE N/A 05/24/2015   Procedure: CERCLAGE CERVICAL;  Surgeon: Marylynn Pearson, MD;  Location: [facility];  Service: Gynecology;  Laterality: N/A;   NO PAST SURGERIES       Family History: Family History  Problem Relation Age of Onset   62 / Stillbirths Maternal Aunt    Cancer Maternal Grandmother    Breast cancer Maternal Grandmother    Stroke Paternal Grandmother      Social History: Renee Coleman lives at home with her family including  her husband who is a band teacher (primary instrument to be) and her now 16-year-old daughter.  She lives in a house.  There is carpeting in the hardwood in the main living areas and carpeting in the bedroom.  She has gas heating and central cooling.  There are no animals inside or outside of the home.  There are dust mite covers on the bedding.  There is no tobacco exposure.  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  She is not exposed to fumes, chemicals, or dust.  She does not live near an interstate or industrial area.  She is ready for the holidays.  Her daughter is going to be getting a Landscape architect, which I did not even know was a thing. She teaches now at [employer] (was previously at [employer]).    Review of Systems  Constitutional: Negative.  Negative for chills, fever, malaise/fatigue and weight loss.  HENT: Negative.  Negative for congestion, ear discharge and ear pain.   Eyes:  Negative for pain, discharge and redness.  Respiratory:  Negative for cough, sputum production, shortness of breath and wheezing.   Cardiovascular: Negative.  Negative for  chest pain and palpitations.  Gastrointestinal:  Negative for abdominal pain, heartburn, nausea and vomiting.  Skin:  Positive for itching and rash.  Neurological:  Negative for dizziness and headaches.  Endo/Heme/Allergies:  Negative for environmental allergies. Does not bruise/bleed easily.       Objective:   Blood pressure (!) 128/98, pulse 75, temperature 98 F (36.7 C), temperature source Temporal, resp. rate 16, height 5' 2.5" (1.588 m), weight 151 lb 9.6 oz (68.8 kg), SpO2 95 %. Body mass index is 27.29 kg/m.  Repeat BP: 140/82     Physical Exam Vitals reviewed.  Constitutional:      Appearance: She is well-developed.     Comments: Very lovely and cooperative with the exam.   HENT:     Head: Normocephalic and atraumatic.     Right Ear: Tympanic membrane, ear canal and external ear normal. No drainage, swelling or tenderness. Tympanic membrane is not injected, scarred, erythematous, retracted or bulging.     Left Ear: Tympanic membrane, ear canal and external ear normal. No drainage, swelling or tenderness. Tympanic membrane is not injected, scarred, erythematous, retracted or bulging.     Nose: No nasal deformity, septal deviation, mucosal edema or rhinorrhea.     Right Turbinates: Enlarged and swollen.     Left Turbinates: Enlarged and swollen.     Right Sinus: No maxillary sinus tenderness or frontal sinus tenderness.     Left Sinus: No maxillary sinus tenderness or frontal sinus tenderness.     Comments: Scant rhinorrhea.     Mouth/Throat:     Mouth: Mucous membranes are not pale and not dry.     Pharynx: Uvula midline.  Eyes:     General:        Right eye: No discharge.        Left eye: No discharge.     Conjunctiva/sclera: Conjunctivae normal.     Right eye: Right conjunctiva is not injected. No chemosis.    Left eye: Left conjunctiva is not injected. No chemosis.    Pupils: Pupils are equal, round, and reactive to light.  Cardiovascular:     Rate and  Rhythm: Normal rate and regular rhythm.     Heart sounds: Normal heart sounds.  Pulmonary:     Effort: Pulmonary effort is normal.  No tachypnea, accessory muscle usage or respiratory distress.     Breath sounds: Normal breath sounds. No wheezing, rhonchi or rales.     Comments: Moving air well in all lung fields. No increased work of breathing noted.  Chest:     Chest wall: No tenderness.  Abdominal:     Tenderness: There is no abdominal tenderness. There is no guarding or rebound.  Lymphadenopathy:     Head:     Right side of head: No submandibular, tonsillar or occipital adenopathy.     Left side of head: No submandibular, tonsillar or occipital adenopathy.     Cervical: No cervical adenopathy.  Skin:    Coloration: Skin is not pale.     Findings: No abrasion, erythema, petechiae or rash. Rash is not papular, urticarial or vesicular.  Neurological:     Mental Status: She is alert.  Psychiatric:        Behavior: Behavior is cooperative.      Diagnostic studies: labs sent instead       Salvatore Marvel, MD [facility]

## 2022-02-25 NOTE — Patient Instructions (Addendum)
1. Chronic urticaria - Your history does not have any "red flags" such as fevers, joint pains, or permanent skin changes that would be concerning for a more serious cause of hives.  - We did not do repeat testing since we did this in 2018 already.  - We are going to put you on a lot of medications until we figure out a cause and then make changes accordingly.  - We will get some labs to rule out serious causes of hives: alpha gal panel, complete blood count, tryptase level, chronic urticaria panel, CMP, ESR, and CRP. - Chronic hives are often times a self limited process and will "burn themselves out" over 6-12 months, although this is not always the case.  - In the meantime, start suppressive dosing of antihistamines:   - Morning: Xyzal (levocetirizine) 5-538m (two tablets) + Pepcid (famotidine) 271m - Evening: Zyrtec (cetirizine) 10-2062mtwo tablets) + Pepcid (famotidine) 70m70mSingulair (montelukast) 10mg11mly - You can change this dosing at home, decreasing the dose as needed or increasing the dosing as needed.  - Singulair can cause some mood changes rarely, so beware of that.  - If you are not tolerating the medications or are tired of taking them every day, we can start treatment with a monthly injectable medication called Xolair.   2. Perennial allergic rhinitis (molds, dust mites) - We are starting the antihistamines, which should help. - We are going to not do repeating testing since your symptoms are not too severe.   3. Return in about 6 weeks (around 04/08/2022).    Please inform us ofKoreany Emergency Department visits, hospitalizations, or changes in symptoms. Call us beKoreare going to the ED for breathing or allergy symptoms since we might be able to fit you in for a sick visit. Feel free to contact us anKoreaime with any questions, problems, or concerns.  It was a pleasure to see you again today! THANKMaykingfor being a teacher!! You guys are under appreciated!   Websites that have  reliable patient information: 1. American Academy of Asthma, Allergy, and Immunology: www.aaaai.org 2. Food Allergy Research and Education (FARE): foodallergy.org 3. Mothers of Asthmatics: http://www.asthmacommunitynetwork.org 4. American College of Allergy, Asthma, and Immunology: www.acaai.org   COVID-19 Vaccine Information can be found at: [email or web address] related to vaccine distribution or appointments, please email [email or web address] or call [phone] realize that you might be concerned about having an allergic reaction to the COVID19 vaccines. To help with that concern, WE ARE OFFERING THE COVID19 VACCINES IN OUR OFFICE! Ask the front desk for dates!     "Like" us onKoreaacebook and Instagram for our latest updates!      A healthy democracy works best when ALL vNew York Life Insuranceicipate! Make sure you are registered to vote! If you have moved or changed any of your contact information, you will need to get this updated before voting!  In some cases, you MAY be able to register to vote online: [email or web address]

## 2022-03-06 LAB — CBC WITH DIFFERENTIAL
Basophils Absolute: 0 10*3/uL (ref 0.0–0.2)
Basos: 0 %
EOS (ABSOLUTE): 0.2 10*3/uL (ref 0.0–0.4)
Eos: 2 %
Hematocrit: 39.3 % (ref 34.0–46.6)
Hemoglobin: 13.1 g/dL (ref 11.1–15.9)
Immature Grans (Abs): 0 10*3/uL (ref 0.0–0.1)
Immature Granulocytes: 0 %
Lymphocytes Absolute: 2.9 10*3/uL (ref 0.7–3.1)
Lymphs: 33 %
MCH: 27.3 pg (ref 26.6–33.0)
MCHC: 33.3 g/dL (ref 31.5–35.7)
MCV: 82 fL (ref 79–97)
Monocytes Absolute: 0.5 10*3/uL (ref 0.1–0.9)
Monocytes: 6 %
Neutrophils Absolute: 5 10*3/uL (ref 1.4–7.0)
Neutrophils: 59 %
RBC: 4.8 x10E6/uL (ref 3.77–5.28)
RDW: 12.1 % (ref 11.7–15.4)
WBC: 8.7 10*3/uL (ref 3.4–10.8)

## 2022-03-06 LAB — ALPHA-GAL PANEL
Allergen Lamb IgE: <0.1 kU/L
Beef IgE: <0.1 kU/L
IgE (Immunoglobulin E), Serum: 70 IU/mL (ref 6–495)
O215-IgE Alpha-Gal: <0.1 kU/L
Pork IgE: <0.1 kU/L

## 2022-03-06 LAB — CMP14+EGFR
ALT: 23 IU/L (ref 0–32)
AST: 25 IU/L (ref 0–40)
Albumin/Globulin Ratio: 1.5 (ref 1.2–2.2)
Albumin: 4.7 g/dL (ref 3.9–4.9)
Alkaline Phosphatase: 94 IU/L (ref 44–121)
BUN/Creatinine Ratio: 12 (ref 9–23)
BUN: 11 mg/dL (ref 6–24)
Bilirubin Total: 0.2 mg/dL (ref 0.0–1.2)
CO2: 20 mmol/L (ref 20–29)
Calcium: 10 mg/dL (ref 8.7–10.2)
Chloride: 103 mmol/L (ref 96–106)
Creatinine, Ser: 0.95 mg/dL (ref 0.57–1.00)
Globulin, Total: 3.1 g/dL (ref 1.5–4.5)
Glucose: 89 mg/dL (ref 70–99)
Potassium: 4.3 mmol/L (ref 3.5–5.2)
Sodium: 140 mmol/L (ref 134–144)
Total Protein: 7.8 g/dL (ref 6.0–8.5)
eGFR: 77 mL/min/{1.73_m2} (ref >59)

## 2022-03-06 LAB — FANA STAINING PATTERNS: Speckled Pattern: 1:160 {titer} — ABNORMAL HIGH

## 2022-03-06 LAB — ANTINUCLEAR ANTIBODIES, IFA: ANA Titer 1: POSITIVE — AB

## 2022-03-06 LAB — CHRONIC URTICARIA: cu index: 44.8 — ABNORMAL HIGH (ref <10)

## 2022-03-06 LAB — C-REACTIVE PROTEIN: CRP: 10 mg/L (ref 0–10)

## 2022-03-06 LAB — THYROID ANTIBODIES
Thyroglobulin Antibody: <1 IU/mL (ref 0.0–0.9)
Thyroperoxidase Ab SerPl-aCnc: 36 IU/mL — ABNORMAL HIGH (ref 0–34)

## 2022-03-06 LAB — SEDIMENTATION RATE: Sed Rate: 38 mm/hr — ABNORMAL HIGH (ref 0–32)

## 2022-03-06 LAB — TRYPTASE: Tryptase: 8.6 ug/L (ref 2.2–13.2)

## 2022-03-12 ENCOUNTER — Telehealth: Payer: Self-pay

## 2022-03-12 NOTE — Telephone Encounter (Signed)
Just a Jerrell Belfast:    Patient called in today to relay the following message below. I have faxed the referral has requested to [facility] for review. I informed the patient to call [facility] to cancel her appointment.    Ozell Mcfayden  You2 days ago    Good morning, I could not get an appointment with [facility] until June. I called [facility] on [address] and they have open appointments for January and February. Can I have you to please send them a referral so that I can be seen there instead? The person handling referrals is [place].  Thank you    You  Claudette Laws Artis6 days ago    Mirna Mires:    My name is Karena Addison & I am one of the Referral Coordinators with the [facility]. Dr. Ernst Bowler has requested a referral be placed to a Rheumatologist on your behalf.    Here is their contact information: [facility] [address] [place],  [place]  [address] Main: [phone]   If you do not hear from their office in 3-5 Business days, please give their office a call to get scheduled.   Please let me know if there is anything I can do to help.   Thank You,   [employer], NT    Regena Mccardle  P Aac [place] Admin (supporting Mychart, Generic)8 days ago    I have not seen a Rheumatologist. Is it possible for you to send a referral to a doctor that accepts  my insurance.

## 2022-03-13 NOTE — Telephone Encounter (Signed)
Thanks for the update!   Shereka Lafortune, MD [facility]  

## 2022-04-17 ENCOUNTER — Ambulatory Visit: Payer: BC Managed Care – PPO | Admitting: Allergy & Immunology

## 2022-09-01 ENCOUNTER — Encounter: Payer: BC Managed Care – PPO | Admitting: Rheumatology

## 2022-09-07 ENCOUNTER — Other Ambulatory Visit: Payer: Self-pay | Admitting: Obstetrics and Gynecology

## 2022-09-07 DIAGNOSIS — Z0000 Encounter for general adult medical examination without abnormal findings: Secondary | ICD-10-CM

## 2022-09-22 ENCOUNTER — Ambulatory Visit: Payer: BC Managed Care – PPO | Admitting: Rheumatology

## 2022-10-19 ENCOUNTER — Ambulatory Visit
Admission: RE | Admit: 2022-10-19 | Discharge: 2022-10-19 | Disposition: A | Discharge status: Home / Self Care | Payer: BC Managed Care – PPO | Source: Ambulatory Visit | Attending: Obstetrics and Gynecology | Admitting: Obstetrics and Gynecology

## 2022-10-19 ENCOUNTER — Ambulatory Visit: Payer: BC Managed Care – PPO

## 2022-10-19 DIAGNOSIS — Z0000 Encounter for general adult medical examination without abnormal findings: Secondary | ICD-10-CM

## 2023-09-02 ENCOUNTER — Other Ambulatory Visit: Payer: Self-pay | Admitting: Obstetrics and Gynecology

## 2023-09-02 DIAGNOSIS — Z1231 Encounter for screening mammogram for malignant neoplasm of breast: Secondary | ICD-10-CM

## 2023-10-20 ENCOUNTER — Ambulatory Visit
Admission: RE | Admit: 2023-10-20 | Discharge: 2023-10-20 | Disposition: A | Discharge status: Home / Self Care | Payer: Self-pay | Source: Ambulatory Visit | Attending: Obstetrics and Gynecology | Admitting: Obstetrics and Gynecology

## 2023-10-20 DIAGNOSIS — Z1231 Encounter for screening mammogram for malignant neoplasm of breast: Secondary | ICD-10-CM
